# Patient Record
Sex: Male | Born: 1990 | Race: White | Hispanic: No | Marital: Married | Smoking: Never smoker
Health system: Southern US, Community
[De-identification: ages and names within clinical notes are randomized; demographics above are authoritative.]

## PROBLEM LIST (undated history)

## (undated) DIAGNOSIS — Z464 Encounter for fitting and adjustment of orthodontic device: Secondary | ICD-10-CM

## (undated) DIAGNOSIS — J45909 Unspecified asthma, uncomplicated: Secondary | ICD-10-CM

## (undated) DIAGNOSIS — K219 Gastro-esophageal reflux disease without esophagitis: Secondary | ICD-10-CM

## (undated) DIAGNOSIS — IMO0001 Reserved for inherently not codable concepts without codable children: Secondary | ICD-10-CM

## (undated) HISTORY — PX: TONSILLECTOMY: SUR1361

---
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
## 2016-01-08 ENCOUNTER — Ambulatory Visit
Admission: RE | Admit: 2016-01-08 | Discharge: 2016-01-08 | Disposition: A | Discharge status: Home / Self Care | Payer: 59 | Source: Ambulatory Visit | Attending: Physician Assistant | Admitting: Physician Assistant

## 2016-01-08 ENCOUNTER — Encounter: Payer: Self-pay | Admitting: Physician Assistant

## 2016-01-08 ENCOUNTER — Ambulatory Visit: Payer: Self-pay | Admitting: Physician Assistant

## 2016-01-08 VITALS — BP 120/70 | HR 79 | Temp 98.2°F

## 2016-01-08 DIAGNOSIS — R1310 Dysphagia, unspecified: Secondary | ICD-10-CM | POA: Diagnosis not present

## 2016-01-08 DIAGNOSIS — R0989 Other specified symptoms and signs involving the circulatory and respiratory systems: Secondary | ICD-10-CM

## 2016-01-08 NOTE — Progress Notes (Signed)
S: c/o sensation of fullness and foreign body in throat for about 2 weeks, pieces of food will get stuck in this area and will have to vomit to get them out, no fever/chills, no dif breathing  O: vitals wnl, nad, throat wnl, neck supple no lymph or masses palpated exteriorly, lungs c t a, cv rrr  A: foreign body sensation in throat  P: soft tissue neck xray, refer to ENT , consider referal to GI

## 2016-01-09 NOTE — Progress Notes (Signed)
I spoke with the patient about his xray report and he expressed understanding.  Per Susan's authorization I referred the patient to [facility].  Referral request was faxed today.

## 2016-04-02 ENCOUNTER — Encounter: Payer: Self-pay | Admitting: Physician Assistant

## 2016-04-02 ENCOUNTER — Encounter: Payer: Self-pay | Admitting: General Surgery

## 2016-04-02 ENCOUNTER — Ambulatory Visit: Payer: Self-pay | Admitting: Physician Assistant

## 2016-04-02 VITALS — BP 110/70 | HR 86 | Temp 98.6°F

## 2016-04-02 DIAGNOSIS — K602 Anal fissure, unspecified: Secondary | ICD-10-CM

## 2016-04-02 NOTE — Patient Instructions (Signed)
Anal Fissure, Adult °An anal fissure is a small tear or crack in the skin around the opening of the butt (anus). Bleeding from the tear or crack usually stops on its own within a few minutes. The bleeding may happen every time you poop (have a bowel movement) until the tear or crack heals. °HOME CARE °Eating and Drinking °· Avoid bananas and dairy products. These foods can make it hard to poop. °· Drink enough fluid to keep your pee (urine) clear or pale yellow. °· Eat a lot of fruit, whole grains, and vegetables. °General Instructions °· Keep the butt area as clean and dry as you can. °· Take a warm water bath (sitz bath) as told by your doctor. Do not use soap. °· Take over-the-counter and prescription medicines only as told by your doctor. °· Use creams or ointments only as told by your doctor. °· Keep all follow-up visits as told by your doctor. This is important. °GET HELP IF: °· You have more bleeding. °· You have a fever. °· You have watery poop (diarrhea) that is mixed with blood. °· You have pain. °· You problem gets worse, not better. °  °This information is not intended to replace advice given to you by your health care provider. Make sure you discuss any questions you have with your health care provider. °  °Document Released: 06/02/2011 Document Revised: 06/25/2015 Document Reviewed: 12/30/2014 °Elsevier Interactive Patient Education ©2016 Elsevier Inc. ° °

## 2016-04-02 NOTE — Progress Notes (Signed)
S: c/o rectal pain, worse with sitting, hurts with bm, occasional blood but no large amounts, says he feels a knot at the rectal area, sx on and off for a while, got really bad last few days  O: vitals wnl, nad, skin intact, rectal exam shows large anal fissure, no external hemorrhoids noted, n/v intact  A: anal fissure  P: warm water soaks, if area feels dry/itchy use small amount of vaseline only once a day, f/u with surgeon on Monday for eval, use miralax for stool softener

## 2016-04-05 ENCOUNTER — Other Ambulatory Visit: Payer: Self-pay

## 2016-04-05 ENCOUNTER — Ambulatory Visit (INDEPENDENT_AMBULATORY_CARE_PROVIDER_SITE_OTHER): Payer: Managed Care, Other (non HMO) | Admitting: General Surgery

## 2016-04-05 ENCOUNTER — Encounter: Payer: Self-pay | Admitting: General Surgery

## 2016-04-05 ENCOUNTER — Encounter: Payer: Self-pay | Admitting: *Deleted

## 2016-04-05 ENCOUNTER — Other Ambulatory Visit: Payer: Self-pay | Admitting: General Surgery

## 2016-04-05 DIAGNOSIS — K611 Rectal abscess: Secondary | ICD-10-CM

## 2016-04-05 DIAGNOSIS — K6289 Other specified diseases of anus and rectum: Secondary | ICD-10-CM | POA: Diagnosis not present

## 2016-04-05 MED ORDER — AMOXICILLIN-POT CLAVULANATE 875-125 MG PO TABS: 1.0000 | ORAL_TABLET | Freq: Two times a day (BID) | ORAL | Status: AC

## 2016-04-05 NOTE — Patient Instructions (Signed)
  Your procedure is scheduled on:04/06/16 Report to Day Surgery. [facility]OND FLOOR To find out your arrival time please call [phone] between 1PM - 3PM on 04/05/16.  Remember: Instructions that are not followed completely may result in serious medical risk, up to and including death, or upon the discretion of your surgeon and anesthesiologist your surgery may need to be rescheduled.    ___X_ 1. Do not eat food or drink liquids after midnight. No gum chewing or hard candies.     _X___ 2. No Alcohol for 24 hours before or after surgery.   _X___ 3. Do Not Smoke For 24 Hours Prior to Your Surgery.   ____ 4. Bring all medications with you on the day of surgery if instructed.    ___X_ 5. Notify your doctor if there is any change in your medical condition     (cold, fever, infections).       Do not wear jewelry, make-up, hairpins, clips or nail polish.  Do not wear lotions, powders, or perfumes. You may wear deodorant.  Do not shave 48 hours prior to surgery. Men may shave face and neck.  Do not bring valuables to the hospital.    [facility] belongings or valuables.               Contacts, dentures or bridgework may not be worn into surgery.  Leave your suitcase in the car. After surgery it may be brought to your room.  For patients admitted to the hospital, discharge time is determined by your                treatment team.   Patients discharged the day of surgery will not be allowed to drive home.   Please read over the following fact sheets that you were given:   Surgical Site Infection Prevention   _X___ Take these medicines the morning of surgery with A SIP OF WATER:    1. OMEPRAZOLE AT BEDTIME 04/05/16 AND AM OF SURGERY  2.   3.   4.  5.  6.  ____ Fleet Enema (as directed)   ____ Use CHG Soap as directed  __X__ Use inhalers on the day of surgery  ____ Stop metformin 2 days prior to surgery    ____ Take 1/2 of usual insulin dose  the night before surgery and none on the morning of surgery.   ____ Stop Coumadin/Plavix/aspirin on  ____ Stop Anti-inflammatories on    ____ Stop supplements until after surgery.    ____ Bring C-Pap to the hospital.

## 2016-04-05 NOTE — Patient Instructions (Signed)
Anal Fissure, Adult °An anal fissure is a small tear or crack in the skin around the anus. Bleeding from a fissure usually stops on its own within a few minutes. However, bleeding will often occur again with each bowel movement until the crack heals. °CAUSES °This condition may be caused by: °· Passing large, hard stool (feces). °· Frequent diarrhea. °· Constipation. °· Inflammatory bowel disease (Crohn disease or ulcerative colitis). °· Infections. °· Anal sex. °SYMPTOMS °Symptoms of this condition include: °· Bleeding from the rectum. °· Small amounts of blood seen on your stool, on toilet paper, or in the toilet after a bowel movement. °· Painful bowel movements. °· Itching or irritation around the anus. °DIAGNOSIS  °A health care provider may diagnose this condition by closely examining the anal area. An anal fissure can usually be seen with careful inspection. In some cases, a rectal exam may be performed, or a short tube (anoscope) may be used to examine the anal canal. °TREATMENT °Treatment for this condition may include: °· Taking steps to avoid constipation. This may include making changes to your diet, such as increasing your intake of fiber or fluid. °· Taking fiber supplements. These supplements can soften your stool to help make bowel movements easier. Your health care provider may also prescribe a stool softener if your stool is often hard. °· Taking sitz baths. This may help to heal the tear. °· Using medicated creams or ointments. These may be prescribed to lessen discomfort. °HOME CARE INSTRUCTIONS °Eating and Drinking °· Avoid foods that may be constipating, such as bananas and dairy products. °· Drink enough fluid to keep your urine clear or pale yellow. °· Maintain a diet that is high in fruits, whole grains, and vegetables. °General Instructions °· Keep the anal area as clean and dry as possible. °· Take sitz baths as told by your health care provider. Do not use soap in the sitz baths. °· Take  over-the-counter and prescription medicines only as told by your health care provider. °· Use creams or ointments only as told by your health care provider. °· Keep all follow-up visits as told by your health care provider. This is important. °SEEK MEDICAL CARE IF: °· You have more bleeding. °· You have a fever. °· You have diarrhea that is mixed with blood. °· You continue to have pain. °· Your problem is getting worse rather than better. °  °This information is not intended to replace advice given to you by your health care provider. Make sure you discuss any questions you have with your health care provider. °  °Document Released: 10/04/2005 Document Revised: 06/25/2015 Document Reviewed: 12/30/2014 °Elsevier Interactive Patient Education ©2016 Elsevier Inc. ° °

## 2016-04-05 NOTE — Progress Notes (Signed)
Patient ID: Brett Fields, male   DOB: 12/19/1990, 24 y.o.   MRN: [id number]  Chief Complaint  Patient presents with  . Other    anal fissure    HPI Brett Fields is a 24 y.o. male here today for a evaluation of a anal fissure. Patient states he noticed some blood with wrapping. This has been going on since Tuesday. Patient has been using sitz baths and Nifidine with lidocaine. Has only found relief with stiz baths. No relief with positional changes.  I have reviewed the history of present illness with the patient.  HPI  No past medical history on file.  Past Surgical History  Procedure Laterality Date  . Wisdom tooth extraction  2012    No family history on file.  Social History Social History  Substance Use Topics  . Smoking status: Never Smoker   . Smokeless tobacco: None  . Alcohol Use: None    No Known Allergies  Current Outpatient Prescriptions  Medication Sig Dispense Refill  . omeprazole (PRILOSEC) 40 MG capsule TAKE ONE CAPSULE BY MOUTH EVERY DAY 30 MINUTES PRIOR TO EVENING MEAL  12  . polyethylene glycol powder (GLYCOLAX/MIRALAX) powder Take 1 Container by mouth once.     No current facility-administered medications for this visit.    Review of Systems Review of Systems  Constitutional: Negative.   Respiratory: Negative.   Cardiovascular: Negative.     Blood pressure 124/72, pulse 76, resp. rate 12, height 5' 9" (1.753 m), weight 197 lb (89.359 kg).  Physical Exam Physical Exam  Constitutional: He is oriented to person, place, and time. He appears well-developed and well-nourished.  Eyes: Conjunctivae are normal. No scleral icterus.  Neck: Neck supple.  Cardiovascular: Normal rate, regular rhythm and normal heart sounds.   Pulmonary/Chest: Effort normal and breath sounds normal.  Abdominal: Soft. Bowel sounds are normal. There is no tenderness.  Genitourinary: Rectal exam shows fissure and tenderness. Rectal exam shows no external hemorrhoid  and no internal hemorrhoid.  Unable to perform adequate internal rectal exam due to extreme pain   Lymphadenopathy:    He has no cervical adenopathy.  Neurological: He is alert and oriented to person, place, and time.  Skin: Skin is warm and dry.  There is marked tenderness in anterior aspect. Even with xylocaine jelly pain and tenderness precluded  Satisy=factory exam.  Data Reviewed  Notes reviewedd Assessment    Unable to make final assessment of rectal abscess or anal fissure due to severe pain on physical exam. Pt is unable to sit or lie on his back without hurting. Possible he has a perirectal abscess anterior location given the degree of pain.     Plan   Discussed fully with pt and his father(by phone) who is a general surgeon. All agreeable to exam under anesthesia and treatment as indicated. Patient to be scheduled for an exam under anesthesia  at [facility] tomorrow  This patient's surgery has been scheduled for tomorrow, 04-06-16.    Ref. Dr. Gilbert..This information has been scribed by Jessica Qualls CMA.    Brett Fields 04/05/2016, 11:54 AM    

## 2016-04-06 ENCOUNTER — Ambulatory Visit: Payer: Managed Care, Other (non HMO) | Admitting: Anesthesiology

## 2016-04-06 ENCOUNTER — Encounter: Payer: Self-pay | Admitting: *Deleted

## 2016-04-06 ENCOUNTER — Ambulatory Visit
Admission: RE | Admit: 2016-04-06 | Discharge: 2016-04-06 | Disposition: A | Discharge status: Home / Self Care | Payer: Managed Care, Other (non HMO) | Source: Ambulatory Visit | Attending: General Surgery | Admitting: General Surgery

## 2016-04-06 ENCOUNTER — Encounter
Admission: RE | Disposition: A | Discharge status: Home / Self Care | Payer: Self-pay | Source: Ambulatory Visit | Attending: General Surgery

## 2016-04-06 DIAGNOSIS — K648 Other hemorrhoids: Secondary | ICD-10-CM | POA: Insufficient documentation

## 2016-04-06 DIAGNOSIS — K611 Rectal abscess: Secondary | ICD-10-CM | POA: Diagnosis not present

## 2016-04-06 DIAGNOSIS — J45909 Unspecified asthma, uncomplicated: Secondary | ICD-10-CM | POA: Diagnosis not present

## 2016-04-06 DIAGNOSIS — K219 Gastro-esophageal reflux disease without esophagitis: Secondary | ICD-10-CM | POA: Diagnosis not present

## 2016-04-06 DIAGNOSIS — Z79899 Other long term (current) drug therapy: Secondary | ICD-10-CM | POA: Diagnosis not present

## 2016-04-06 HISTORY — PX: INCISION AND DRAINAGE PERIRECTAL ABSCESS: SHX1804

## 2016-04-06 HISTORY — DX: Unspecified asthma, uncomplicated: J45.909

## 2016-04-06 HISTORY — PX: RECTAL EXAM UNDER ANESTHESIA: SHX6399

## 2016-04-06 HISTORY — DX: Gastro-esophageal reflux disease without esophagitis: K21.9

## 2016-04-06 SURGERY — EXAM UNDER ANESTHESIA, RECTUM
Anesthesia: General | Wound class: Clean Contaminated

## 2016-04-06 MED ORDER — PROPOFOL 10 MG/ML IV BOLUS
INTRAVENOUS | Status: DC | PRN
  Administered 2016-04-06: 180 mg via INTRAVENOUS
  Administered 2016-04-06: 20 mg via INTRAVENOUS

## 2016-04-06 MED ORDER — PIPERACILLIN-TAZOBACTAM 3.375 G IVPB 30 MIN
3.3750 g | Freq: Once | INTRAVENOUS | Status: AC
  Administered 2016-04-06: 3.375 g via INTRAVENOUS
  Filled 2016-04-06: qty 50

## 2016-04-06 MED ORDER — LACTATED RINGERS IV SOLN
INTRAVENOUS | Status: DC
  Administered 2016-04-06: 11:00:00 via INTRAVENOUS

## 2016-04-06 MED ORDER — ACETAMINOPHEN 10 MG/ML IV SOLN
INTRAVENOUS | Status: AC
  Filled 2016-04-06: qty 100

## 2016-04-06 MED ORDER — LIDOCAINE HCL (PF) 1 % IJ SOLN
INTRAMUSCULAR | Status: AC
  Filled 2016-04-06: qty 30

## 2016-04-06 MED ORDER — PIPERACILLIN-TAZOBACTAM 3.375 G IVPB
INTRAVENOUS | Status: AC
  Administered 2016-04-06: 3.375 g via INTRAVENOUS
  Filled 2016-04-06: qty 50

## 2016-04-06 MED ORDER — BUPIVACAINE HCL (PF) 0.5 % IJ SOLN
INTRAMUSCULAR | Status: DC | PRN
  Administered 2016-04-06: 20 mL

## 2016-04-06 MED ORDER — ACETAMINOPHEN 10 MG/ML IV SOLN
INTRAVENOUS | Status: DC | PRN
  Administered 2016-04-06: 1000 mg via INTRAVENOUS

## 2016-04-06 MED ORDER — ONDANSETRON HCL 4 MG/2ML IJ SOLN
INTRAMUSCULAR | Status: DC | PRN
  Administered 2016-04-06: 4 mg via INTRAVENOUS

## 2016-04-06 MED ORDER — ONDANSETRON HCL 4 MG/2ML IJ SOLN: 4.0000 mg | Freq: Once | INTRAMUSCULAR | Status: DC | PRN

## 2016-04-06 MED ORDER — LIDOCAINE HCL (CARDIAC) 20 MG/ML IV SOLN
INTRAVENOUS | Status: DC | PRN
  Administered 2016-04-06: 40 mg via INTRAVENOUS

## 2016-04-06 MED ORDER — BUPIVACAINE HCL (PF) 0.5 % IJ SOLN
INTRAMUSCULAR | Status: AC
  Filled 2016-04-06: qty 30

## 2016-04-06 MED ORDER — FENTANYL CITRATE (PF) 100 MCG/2ML IJ SOLN
INTRAMUSCULAR | Status: DC | PRN
  Administered 2016-04-06: 100 ug via INTRAVENOUS
  Administered 2016-04-06: 50 ug via INTRAVENOUS

## 2016-04-06 MED ORDER — MIDAZOLAM HCL 2 MG/2ML IJ SOLN
INTRAMUSCULAR | Status: DC | PRN
  Administered 2016-04-06: 2 mg via INTRAVENOUS

## 2016-04-06 MED ORDER — FENTANYL CITRATE (PF) 100 MCG/2ML IJ SOLN: 25.0000 ug | INTRAMUSCULAR | Status: DC | PRN

## 2016-04-06 MED ORDER — BACITRACIN ZINC 500 UNIT/GM EX OINT
TOPICAL_OINTMENT | CUTANEOUS | Status: AC
  Filled 2016-04-06: qty 28.35

## 2016-04-06 MED ORDER — OXYCODONE-ACETAMINOPHEN 5-325 MG PO TABS
1.0000 | ORAL_TABLET | Freq: Once | ORAL | Status: AC
  Administered 2016-04-06: 1 via ORAL

## 2016-04-06 MED ORDER — OXYCODONE-ACETAMINOPHEN 5-325 MG PO TABS
ORAL_TABLET | ORAL | Status: AC
  Administered 2016-04-06: 1 via ORAL
  Filled 2016-04-06: qty 1

## 2016-04-06 SURGICAL SUPPLY — 26 items
BLADE SURG 15 STRL SS SAFETY (BLADE) ×4 IMPLANT
BRIEF STRETCH MATERNITY 2XLG (MISCELLANEOUS) ×4 IMPLANT
CANISTER SUCT 1200ML W/VALVE (MISCELLANEOUS) ×4 IMPLANT
DRAPE LAPAROTOMY 77X122 PED (DRAPES) ×4 IMPLANT
DRAPE LEGGINS SURG 28X43 STRL (DRAPES) ×4 IMPLANT
DRAPE TABLE BACK 80X90 (DRAPES) IMPLANT
DRAPE UNDER BUTTOCK W/FLU (DRAPES) ×4 IMPLANT
ELECT REM PT RETURN 9FT ADLT (ELECTROSURGICAL) ×4
ELECTRODE REM PT RTRN 9FT ADLT (ELECTROSURGICAL) ×2 IMPLANT
GLOVE BIO SURGEON STRL SZ7 (GLOVE) ×12 IMPLANT
GLOVE INDICATOR 7.5 STRL GRN (GLOVE) ×12 IMPLANT
GOWN STRL REUS W/ TWL LRG LVL3 (GOWN DISPOSABLE) ×6 IMPLANT
GOWN STRL REUS W/TWL LRG LVL3 (GOWN DISPOSABLE) ×6
KIT RM TURNOVER CYSTO AR (KITS) ×4 IMPLANT
LABEL OR SOLS (LABEL) IMPLANT
NEEDLE HYPO 25X1 1.5 SAFETY (NEEDLE) ×4 IMPLANT
PACK BASIN MINOR ARMC (MISCELLANEOUS) ×4 IMPLANT
PAD OB MATERNITY 4.3X12.25 (PERSONAL CARE ITEMS) ×4 IMPLANT
PAD PREP 24X41 OB/GYN DISP (PERSONAL CARE ITEMS) ×4 IMPLANT
SOL PREP PVP 2OZ (MISCELLANEOUS) ×4
SOLUTION PREP PVP 2OZ (MISCELLANEOUS) ×2 IMPLANT
SUCT SIGMOIDOSCOPE TIP 18 W/TU (SUCTIONS) IMPLANT
SURGILUBE 2OZ TUBE FLIPTOP (MISCELLANEOUS) ×8 IMPLANT
SUT VIC AB 3-0 SH 27 (SUTURE)
SUT VIC AB 3-0 SH 27X BRD (SUTURE) IMPLANT
SYR CONTROL 10ML (SYRINGE) ×4 IMPLANT

## 2016-04-06 NOTE — Discharge Instructions (Signed)
AMBULATORY SURGERY  °DISCHARGE INSTRUCTIONS ° ° °1) The drugs that you were given will stay in your system until tomorrow so for the next 24 hours you should not: ° °A) Drive an automobile °B) Make any legal decisions °C) Drink any alcoholic beverage ° ° °2) You may resume regular meals tomorrow.  Today it is better to start with liquids and gradually work up to solid foods. ° °You may eat anything you prefer, but it is better to start with liquids, then soup and crackers, and gradually work up to solid foods. ° ° °3) Please notify your doctor immediately if you have any unusual bleeding, trouble breathing, redness and pain at the surgery site, drainage, fever, or pain not relieved by medication. ° ° ° °4) Additional Instructions: ° ° ° ° ° ° ° °Please contact your physician with any problems or Same Day Surgery at [phone], Monday through Friday 6 am to 4 pm, or [place] [facility] [place] Main number at [phone]. °

## 2016-04-06 NOTE — H&P (View-Only) (Signed)
Patient ID: Brett Fields, male   DOB: Sep 05, 1991, 25 y.o.   MRN: [id number]  Chief Complaint  Patient presents with  . Other    anal fissure    HPI Brett Fields is a 25 y.o. male here today for a evaluation of a anal fissure. Patient states he noticed some blood with wrapping. This has been going on since Tuesday. Patient has been using sitz baths and Nifidine with lidocaine. Has only found relief with stiz baths. No relief with positional changes.  I have reviewed the history of present illness with the patient.  HPI  No past medical history on file.  Past Surgical History  Procedure Laterality Date  . Wisdom tooth extraction  2012    No family history on file.  Social History Social History  Substance Use Topics  . Smoking status: Never Smoker   . Smokeless tobacco: None  . Alcohol Use: None    No Known Allergies  Current Outpatient Prescriptions  Medication Sig Dispense Refill  . omeprazole (PRILOSEC) 40 MG capsule TAKE ONE CAPSULE BY MOUTH EVERY DAY 30 MINUTES PRIOR TO EVENING MEAL  12  . polyethylene glycol powder (GLYCOLAX/MIRALAX) powder Take 1 Container by mouth once.     No current facility-administered medications for this visit.    Review of Systems Review of Systems  Constitutional: Negative.   Respiratory: Negative.   Cardiovascular: Negative.     Blood pressure 124/72, pulse 76, resp. rate 12, height 5\' 9"  (1.753 m), weight 197 lb (89.359 kg).  Physical Exam Physical Exam  Constitutional: He is oriented to person, place, and time. He appears well-developed and well-nourished.  Eyes: Conjunctivae are normal. No scleral icterus.  Neck: Neck supple.  Cardiovascular: Normal rate, regular rhythm and normal heart sounds.   Pulmonary/Chest: Effort normal and breath sounds normal.  Abdominal: Soft. Bowel sounds are normal. There is no tenderness.  Genitourinary: Rectal exam shows fissure and tenderness. Rectal exam shows no external hemorrhoid  and no internal hemorrhoid.  Unable to perform adequate internal rectal exam due to extreme pain   Lymphadenopathy:    He has no cervical adenopathy.  Neurological: He is alert and oriented to person, place, and time.  Skin: Skin is warm and dry.  There is marked tenderness in anterior aspect. Even with xylocaine jelly pain and tenderness precluded  Satisy=factory exam.  Data Reviewed  Notes reviewedd Assessment    Unable to make final assessment of rectal abscess or anal fissure due to severe pain on physical exam. Pt is unable to sit or lie on his back without hurting. Possible he has a perirectal abscess anterior location given the degree of pain.     Plan   Discussed fully with pt and his father(by phone) who is a Development worker, international aidgeneral surgeon. All agreeable to exam under anesthesia and treatment as indicated. [facility]  This patient's surgery has been scheduled for tomorrow, 04-06-16.    Ref. Dr. Sullivan LoneGilbert..This information has been scribed by Ples SpecterJessica Qualls CMA.    SANKAR,SEEPLAPUTHUR G 04/05/2016, 11:54 AM

## 2016-04-06 NOTE — Interval H&P Note (Signed)
History and Physical Interval Note:  04/06/2016 11:36 AM  Brett McalpineNathaniel W Fields  has presented today for surgery, with the diagnosis of RECTAL PAIN  The various methods of treatment have been discussed with the patient and family. After consideration of risks, benefits and other options for treatment, the patient has consented to  Procedure(s): RECTAL EXAM UNDER ANESTHESIA (N/A) FISSURECTOMY (N/A) SPHINCTEROTOMY (N/A) as a surgical intervention .  The patient's history has been reviewed, patient examined, no change in status, stable for surgery.  I have reviewed the patient's chart and labs.  Questions were answered to the patient's satisfaction.     SANKAR,SEEPLAPUTHUR G

## 2016-04-06 NOTE — Anesthesia Postprocedure Evaluation (Signed)
Anesthesia Post Note  Patient: Marland Mcalpineathaniel W Trnka  Procedure(s) Performed: Procedure(s) (LRB): RECTAL EXAM UNDER ANESTHESIA (N/A) IRRIGATION AND DEBRIDEMENT PERIRECTAL ABSCESS  Patient location during evaluation: PACU Anesthesia Type: General Level of consciousness: awake and alert Pain management: pain level controlled Vital Signs Assessment: post-procedure vital signs reviewed and stable Respiratory status: spontaneous breathing, nonlabored ventilation, respiratory function stable and patient connected to nasal cannula oxygen Cardiovascular status: blood pressure returned to baseline and stable Postop Assessment: no signs of nausea or vomiting Anesthetic complications: no    Last Vitals:  Filed Vitals:   04/06/16 1353 04/06/16 1459  BP: 114/56 111/54  Pulse: 81 81  Temp: 37.1 C   Resp: 16 16    Last Pain:  Filed Vitals:   04/06/16 1500  PainSc: 2                  Teresea Donley S

## 2016-04-06 NOTE — Op Note (Signed)
Preop diagnosis: Rectal pain and suspected perianal abscess versus fissure  Post op diagnosis: Anterior perirectal abscess drained internally  Operation: Drainage of perirectal abscess , rectal  Exam. Internal hemorrhoids  Surgeon: Horton ChinS. J. Syna Gad Assistant:     Anesthesia: Gen.  Complications: None  EBL: Minimal  Drains: None  Description: Patient was put to sleep with an LMA and placed in the lithotomy position. Anal area was prepped and draped sterile field. Timeout was performed. There were no visible abnormality in the external skin surrounding the anal area. Distal speculum examination was then completed with the finding of a small opening anteriorly inside the 8 just above the dentate line with a small amount of purulent material draining and pressure from the anterior aspect in the extruded a little bit more of this. There was no palpable induration or masslike effect in the area of the abscess. The patient did have 2 prominent internal hemorrhoids which are not prolapsing at this time nor bleeding. After ensuring that there were no abnormalities in that the abscesses satisfactorily drained speculum was withdrawn the surrounding area was infiltrated with 20 mL of 0.5% Marcaine for postop analgesia. Patient subsequently was reversed extubated and returned recovery room stable condition

## 2016-04-06 NOTE — Anesthesia Preprocedure Evaluation (Signed)
Anesthesia Evaluation  Patient identified by MRN, date of birth, ID band Patient awake    Reviewed: Allergy & Precautions, NPO status , Patient's Chart, lab work & pertinent test results  History of Anesthesia Complications Negative for: history of anesthetic complications  Airway Mallampati: I       Dental   Pulmonary neg pulmonary ROS, asthma ,           Cardiovascular negative cardio ROS       Neuro/Psych negative neurological ROS     GI/Hepatic Neg liver ROS, GERD  Medicated and Controlled,  Endo/Other  negative endocrine ROS  Renal/GU negative Renal ROS     Musculoskeletal   Abdominal   Peds  Hematology   Anesthesia Other Findings   Reproductive/Obstetrics                             Anesthesia Physical Anesthesia Plan  ASA: II  Anesthesia Plan: General   Post-op Pain Management:    Induction: Intravenous  Airway Management Planned: LMA  Additional Equipment:   Intra-op Plan:   Post-operative Plan:   Informed Consent: I have reviewed the patients History and Physical, chart, labs and discussed the procedure including the risks, benefits and alternatives for the proposed anesthesia with the patient or authorized representative who has indicated his/her understanding and acceptance.     Plan Discussed with:   Anesthesia Plan Comments:         Anesthesia Quick Evaluation

## 2016-04-06 NOTE — Transfer of Care (Signed)
Immediate Anesthesia Transfer of Care Note  Patient: Brett Fields  Procedure(s) Performed: Procedure(s): RECTAL EXAM UNDER ANESTHESIA (N/A) IRRIGATION AND DEBRIDEMENT PERIRECTAL ABSCESS  Patient Location: PACU  Anesthesia Type:General  Level of Consciousness: awake  Airway & Oxygen Therapy: Patient Spontanous Breathing and Patient connected to face mask oxygen  Post-op Assessment: Report given to RN and Post -op Vital signs reviewed and stable  Post vital signs: Reviewed and stable  Last Vitals:  Filed Vitals:   04/06/16 1003  BP: 131/94  Pulse: 122  Temp: 37.1 C  Resp: 20    Last Pain:  Filed Vitals:   04/06/16 1258  PainSc: 4       Patients Stated Pain Goal: 2 (04/06/16 1003)  Complications: No apparent anesthesia complications

## 2016-04-06 NOTE — Anesthesia Procedure Notes (Signed)
Procedure Name: LMA Insertion Date/Time: 04/06/2016 12:10 PM Performed by: Henrietta HooverPOPE, Tsuruko Murtha Pre-anesthesia Checklist: Patient identified, Emergency Drugs available, Suction available, Patient being monitored and Timeout performed Patient Re-evaluated:Patient Re-evaluated prior to inductionOxygen Delivery Method: Circle system utilized Preoxygenation: Pre-oxygenation with 100% oxygen Intubation Type: IV induction Ventilation: Mask ventilation without difficulty LMA: LMA inserted LMA Size: 4.0 Number of attempts: 1 Placement Confirmation: positive ETCO2 and breath sounds checked- equal and bilateral Tube secured with: Tape Dental Injury: Teeth and Oropharynx as per pre-operative assessment

## 2016-04-07 ENCOUNTER — Encounter: Payer: Self-pay | Admitting: General Surgery

## 2016-04-22 ENCOUNTER — Ambulatory Visit: Payer: Managed Care, Other (non HMO) | Admitting: General Surgery

## 2016-06-15 ENCOUNTER — Encounter: Payer: Self-pay | Admitting: *Deleted

## 2016-08-05 ENCOUNTER — Encounter: Payer: Self-pay | Admitting: Physician Assistant

## 2016-08-05 ENCOUNTER — Ambulatory Visit: Payer: Self-pay | Admitting: Physician Assistant

## 2016-08-05 VITALS — BP 100/80 | HR 78 | Temp 98.5°F

## 2016-08-05 DIAGNOSIS — R1310 Dysphagia, unspecified: Secondary | ICD-10-CM

## 2016-08-05 NOTE — Progress Notes (Signed)
S: c/o feeling like he has a lump in his throat, not every day, just comes and goes, will sometimes feel like food gets hung up, cousin had thyroid issues and had to have his removed, father is a Careers advisersurgeon and suggested he see GI, no v/d, no dif breathing, no weight loss/gain, is fatigued  O: vitals wnl, nad, thyroid nontender, no nodules palpated, lungs c t a, cv rrr  A: dysphagia  P: exec panel (fasting) tomorrow, will refer to GI, if thyroid levels off will do US of thyroid

## 2016-08-06 ENCOUNTER — Other Ambulatory Visit: Payer: Self-pay

## 2016-08-06 DIAGNOSIS — Z299 Encounter for prophylactic measures, unspecified: Secondary | ICD-10-CM

## 2016-08-06 NOTE — Progress Notes (Signed)
Patient came in to have blood drawn for testing per Susan's authorization. 

## 2016-08-07 LAB — CMP12+LP+TP+TSH+6AC+CBC/D/PLT
ALBUMIN: 4.7 g/dL (ref 3.5–5.5)
ALK PHOS: 50 IU/L (ref 39–117)
ALT: 42 IU/L (ref 0–44)
AST: 23 IU/L (ref 0–40)
Albumin/Globulin Ratio: 2 (ref 1.2–2.2)
BASOS: 0 %
BUN/Creatinine Ratio: 17 (ref 9–20)
BUN: 14 mg/dL (ref 6–20)
Basophils Absolute: 0 10*3/uL (ref 0.0–0.2)
Bilirubin Total: 0.6 mg/dL (ref 0.0–1.2)
CALCIUM: 9.9 mg/dL (ref 8.7–10.2)
CHOLESTEROL TOTAL: 194 mg/dL (ref 100–199)
Chloride: 102 mmol/L (ref 96–106)
Chol/HDL Ratio: 4.3 ratio units (ref 0.0–5.0)
Creatinine, Ser: 0.84 mg/dL (ref 0.76–1.27)
EOS (ABSOLUTE): 0.1 10*3/uL (ref 0.0–0.4)
ESTIMATED CHD RISK: 0.8 times avg. (ref 0.0–1.0)
Eos: 1 %
FREE THYROXINE INDEX: 2 (ref 1.2–4.9)
GFR calc Af Amer: 142 mL/min/{1.73_m2} (ref >59)
GFR calc non Af Amer: 123 mL/min/{1.73_m2} (ref >59)
GGT: 20 IU/L (ref 0–65)
GLOBULIN, TOTAL: 2.3 g/dL (ref 1.5–4.5)
Glucose: 104 mg/dL — ABNORMAL HIGH (ref 65–99)
HDL: 45 mg/dL (ref >39)
Hematocrit: 47.2 % (ref 37.5–51.0)
Hemoglobin: 16.5 g/dL (ref 12.6–17.7)
IMMATURE GRANS (ABS): 0 10*3/uL (ref 0.0–0.1)
IMMATURE GRANULOCYTES: 0 %
IRON: 92 ug/dL (ref 38–169)
LDH: 151 IU/L (ref 121–224)
LDL CALC: 119 mg/dL — AB (ref 0–99)
LYMPHS ABS: 1.5 10*3/uL (ref 0.7–3.1)
LYMPHS: 30 %
MCH: 28.4 pg (ref 26.6–33.0)
MCHC: 35 g/dL (ref 31.5–35.7)
MCV: 81 fL (ref 79–97)
MONOS ABS: 0.6 10*3/uL (ref 0.1–0.9)
Monocytes: 11 %
NEUTROS PCT: 58 %
Neutrophils Absolute: 2.9 10*3/uL (ref 1.4–7.0)
PLATELETS: 263 10*3/uL (ref 150–379)
Phosphorus: 3.8 mg/dL (ref 2.5–4.5)
Potassium: 4.7 mmol/L (ref 3.5–5.2)
RBC: 5.82 x10E6/uL — ABNORMAL HIGH (ref 4.14–5.80)
RDW: 13.5 % (ref 12.3–15.4)
SODIUM: 141 mmol/L (ref 134–144)
T3 UPTAKE RATIO: 24 % (ref 24–39)
T4 TOTAL: 8.2 ug/dL (ref 4.5–12.0)
TOTAL PROTEIN: 7 g/dL (ref 6.0–8.5)
TRIGLYCERIDES: 152 mg/dL — AB (ref 0–149)
TSH: 0.812 u[IU]/mL (ref 0.450–4.500)
Uric Acid: 5.8 mg/dL (ref 3.7–8.6)
VLDL Cholesterol Cal: 30 mg/dL (ref 5–40)
WBC: 5.1 10*3/uL (ref 3.4–10.8)

## 2016-08-10 LAB — HGB A1C W/O EAG: HEMOGLOBIN A1C: 5.2 % (ref 4.8–5.6)

## 2016-08-10 LAB — SPECIMEN STATUS REPORT

## 2016-08-17 ENCOUNTER — Other Ambulatory Visit: Payer: Self-pay

## 2016-08-17 ENCOUNTER — Encounter: Payer: Self-pay | Admitting: *Deleted

## 2016-08-17 ENCOUNTER — Encounter: Payer: Self-pay | Admitting: Gastroenterology

## 2016-08-17 ENCOUNTER — Ambulatory Visit (INDEPENDENT_AMBULATORY_CARE_PROVIDER_SITE_OTHER): Payer: Managed Care, Other (non HMO) | Admitting: Gastroenterology

## 2016-08-17 VITALS — BP 130/78 | HR 75 | Temp 98.2°F | Ht 70.0 in | Wt 201.0 lb

## 2016-08-17 DIAGNOSIS — K219 Gastro-esophageal reflux disease without esophagitis: Secondary | ICD-10-CM

## 2016-08-17 DIAGNOSIS — R1310 Dysphagia, unspecified: Secondary | ICD-10-CM | POA: Insufficient documentation

## 2016-08-17 DIAGNOSIS — R1319 Other dysphagia: Secondary | ICD-10-CM

## 2016-08-17 NOTE — Patient Instructions (Signed)
I have scheduled your upper endoscopy for 08/18/2016. They will be calling you today after 2:00 to give you the exact time to come in. If you haven't heard from them by 3:30, call the number on the paper I gave you to find out the exact time to come in. You are going to need someone to take you there, stay with you, then bring you back home because you will be sedated.   I have scheduled you for a follow up appointment with Dr. Tobi BastosAnna. Please refer to your appointment date and time below. Please call us if you need to cancel, reschedule, or if you have any questions or concerns.

## 2016-08-17 NOTE — Progress Notes (Signed)
Gastroenterology Consultation  Referring Provider:     No ref. provider found Primary Care Physician:  No PCP Per Patient Primary Gastroenterologist:  Dr. Wyline MoodKiran Pammie Chirino  Reason for Consultation:     Dysphagia        HPI:   Brett Fields is a 25 y.o. y/o male referred for consultation & management.   Dysphagia: Onset and any progression: First episoide was in February 2017 , went to an ENT and had a "camera test" and was prescribed omeprazole , he took omeprazole  For a few months and it relieved the issues with swallowing, then stopped and the issue began to recur in September 2017 .  Frequency: Occurs upto 2 days a week and has not got any worse. In total since September has had 10 occasions  Foods affected : Usually affects solids , soft food more often than meat, salad gets stuck very often. Points to the upper part of his chest , feels like a pressure , sometimes he has to "throw up the food " to feel better.  Prior episodes of impaction:  Not required any ER visits for the same  History of asthma/allergy : History of asthma .  History of heartburn/Reflux : Does have acid reflux and describes sensation of " burning in chest " worse when he "burps" and at times brings up "acid" Weight loss : none , not a smoker   Prior EGD: none  PPI/H2 blocker use : Omeprazole.   Presently still has symptoms.   He restarted omeprazole on September 20th, takes it once a day , morning , empty stomach .    Past Medical History:  Diagnosis Date  . Asthma   . GERD (gastroesophageal reflux disease)     Past Surgical History:  Procedure Laterality Date  . INCISION AND DRAINAGE PERIRECTAL ABSCESS  04/06/2016   Procedure: IRRIGATION AND DEBRIDEMENT PERIRECTAL ABSCESS;  Surgeon: Kieth BrightlySeeplaputhur G Sankar, MD;  Location: [facility] ORS;  Service: General;;  . RECTAL EXAM UNDER ANESTHESIA N/A 04/06/2016   Procedure: RECTAL EXAM UNDER ANESTHESIA;  Surgeon: Kieth BrightlySeeplaputhur G Sankar, MD;  Location: [facility] ORS;   Service: General;  Laterality: N/A;  . TONSILLECTOMY    . WISDOM TOOTH EXTRACTION  2012    Prior to Admission medications   Medication Sig Start Date End Date Taking? Authorizing Provider  omeprazole (PRILOSEC) 40 MG capsule TAKE ONE CAPSULE BY MOUTH EVERY DAY 30 MINUTES PRIOR TO EVENING MEAL 03/01/16  Yes Historical Provider, MD  polyethylene glycol powder (GLYCOLAX/MIRALAX) powder Take 1 Container by mouth once.   Yes Historical Provider, MD    History reviewed. No pertinent family history.   Social History  Substance Use Topics  . Smoking status: Never Smoker  . Smokeless tobacco: Never Used  . Alcohol use No    Allergies as of 08/17/2016  . (No Known Allergies)    Review of Systems:    All systems reviewed and negative except where noted in HPI.   Physical Exam:  There were no vitals taken for this visit. No LMP for male patient. Psych:  Alert and cooperative. Normal mood and affect. General:   Alert,  Well-developed, well-nourished, pleasant and cooperative in NAD Head:  Normocephalic and atraumatic. Eyes:  Sclera clear, no icterus.   Conjunctiva pink. Ears:  Normal auditory acuity. Nose:  No deformity, discharge, or lesions. Mouth:  No deformity or lesions,oropharynx pink & moist. Neck:  Supple; no masses or thyromegaly. Lungs:  Respirations even and unlabored.  Clear throughout  to auscultation.   No wheezes, crackles, or rhonchi. No acute distress. Heart:  Regular rate and rhythm; no murmurs, clicks, rubs, or gallops. Abdomen:  Normal bowel sounds.  No bruits.  Soft, non-tender and non-distended without masses, hepatosplenomegaly or hernias noted.  No guarding or rebound tenderness.    Msk:  Symmetrical without gross deformities. Good, equal movement & strength bilaterally. Pulses:  Normal pulses noted. Extremities:  No clubbing or edema.  No cyanosis. Neurologic:  Alert and oriented x3;  grossly normal neurologically. Skin:  Intact without significant lesions or  rashes. No jaundice. Lymph Nodes:  No significant cervical adenopathy. Psych:  Alert and cooperative. Normal mood and affect.  Imaging Studies: No results found.  Assessment and Plan:   Brett Fields is a 25 y.o. y/o male has been referred for dysphagia. He has had on and off symptoms for a few months, with some but not complete relief with use of PPI. He does have a history of asthma.Presently on PPI but still symptomatic.   Plan :  1. Dysphagia : EGD to evaluate for eosinophilic esophagitis.  2. GERD: on PPI , will discuss further management at next    I have discussed alternative options, risks & benefits,  which include, but are not limited to, bleeding, infection, perforation,respiratory complication & drug reaction.  The patient agrees with this plan & written consent will be obtained.    Follow up in 4 weeks.   Dr Wyline MoodKiran Chayson Charters MD

## 2016-08-18 ENCOUNTER — Ambulatory Visit
Admission: RE | Admit: 2016-08-18 | Discharge: 2016-08-18 | Disposition: A | Discharge status: Home / Self Care | Payer: Managed Care, Other (non HMO) | Source: Ambulatory Visit | Attending: Gastroenterology | Admitting: Gastroenterology

## 2016-08-18 ENCOUNTER — Ambulatory Visit: Payer: Managed Care, Other (non HMO) | Admitting: Anesthesiology

## 2016-08-18 ENCOUNTER — Encounter
Admission: RE | Disposition: A | Discharge status: Home / Self Care | Payer: Self-pay | Source: Ambulatory Visit | Attending: Gastroenterology

## 2016-08-18 DIAGNOSIS — K219 Gastro-esophageal reflux disease without esophagitis: Secondary | ICD-10-CM | POA: Insufficient documentation

## 2016-08-18 DIAGNOSIS — R1310 Dysphagia, unspecified: Secondary | ICD-10-CM | POA: Diagnosis not present

## 2016-08-18 DIAGNOSIS — J45909 Unspecified asthma, uncomplicated: Secondary | ICD-10-CM | POA: Insufficient documentation

## 2016-08-18 HISTORY — DX: Encounter for fitting and adjustment of orthodontic device: Z46.4

## 2016-08-18 HISTORY — DX: Reserved for inherently not codable concepts without codable children: IMO0001

## 2016-08-18 HISTORY — PX: ESOPHAGOGASTRODUODENOSCOPY (EGD) WITH PROPOFOL: SHX5813

## 2016-08-18 SURGERY — ESOPHAGOGASTRODUODENOSCOPY (EGD) WITH PROPOFOL
Anesthesia: Monitor Anesthesia Care | Wound class: Clean Contaminated

## 2016-08-18 MED ORDER — ACETAMINOPHEN 325 MG PO TABS: 325.0000 mg | ORAL_TABLET | ORAL | Status: DC | PRN

## 2016-08-18 MED ORDER — GLYCOPYRROLATE 0.2 MG/ML IJ SOLN
INTRAMUSCULAR | Status: DC | PRN
  Administered 2016-08-18: .2 mg via INTRAVENOUS

## 2016-08-18 MED ORDER — LACTATED RINGERS IV SOLN
INTRAVENOUS | Status: DC
  Administered 2016-08-18: 08:00:00 via INTRAVENOUS

## 2016-08-18 MED ORDER — LIDOCAINE HCL (CARDIAC) 20 MG/ML IV SOLN
INTRAVENOUS | Status: DC | PRN
  Administered 2016-08-18: 50 mg via INTRAVENOUS

## 2016-08-18 MED ORDER — PROPOFOL 10 MG/ML IV BOLUS
INTRAVENOUS | Status: DC | PRN
  Administered 2016-08-18 (×2): 100 mg via INTRAVENOUS
  Administered 2016-08-18: 50 mg via INTRAVENOUS
  Administered 2016-08-18: 100 mg via INTRAVENOUS

## 2016-08-18 MED ORDER — ACETAMINOPHEN 160 MG/5ML PO SOLN: 325.0000 mg | ORAL | Status: DC | PRN

## 2016-08-18 SURGICAL SUPPLY — 32 items
BALLN DILATOR 10-12 8 (BALLOONS)
BALLN DILATOR 12-15 8 (BALLOONS)
BALLN DILATOR 15-18 8 (BALLOONS)
BALLN DILATOR CRE 0-12 8 (BALLOONS)
BALLN DILATOR ESOPH 8 10 CRE (MISCELLANEOUS) IMPLANT
BALLOON DILATOR 12-15 8 (BALLOONS) IMPLANT
BALLOON DILATOR 15-18 8 (BALLOONS) IMPLANT
BALLOON DILATOR CRE 0-12 8 (BALLOONS) IMPLANT
BLOCK BITE 60FR ADLT L/F GRN (MISCELLANEOUS) ×2 IMPLANT
CANISTER SUCT 1200ML W/VALVE (MISCELLANEOUS) ×2 IMPLANT
CLIP HMST 235XBRD CATH ROT (MISCELLANEOUS) IMPLANT
CLIP RESOLUTION 360 11X235 (MISCELLANEOUS)
FCP ESCP3.2XJMB 240X2.8X (MISCELLANEOUS)
FORCEPS BIOP RAD 4 LRG CAP 4 (CUTTING FORCEPS) ×2 IMPLANT
FORCEPS BIOP RJ4 240 W/NDL (MISCELLANEOUS)
FORCEPS ESCP3.2XJMB 240X2.8X (MISCELLANEOUS) IMPLANT
GOWN CVR UNV OPN BCK APRN NK (MISCELLANEOUS) ×2 IMPLANT
GOWN ISOL THUMB LOOP REG UNIV (MISCELLANEOUS) ×2
INJECTOR VARIJECT VIN23 (MISCELLANEOUS) IMPLANT
KIT DEFENDO VALVE AND CONN (KITS) IMPLANT
KIT ENDO PROCEDURE OLY (KITS) ×2 IMPLANT
MARKER SPOT ENDO TATTOO 5ML (MISCELLANEOUS) IMPLANT
PAD GROUND ADULT SPLIT (MISCELLANEOUS) IMPLANT
RETRIEVER NET PLAT FOOD (MISCELLANEOUS) IMPLANT
SNARE SHORT THROW 13M SML OVAL (MISCELLANEOUS) IMPLANT
SNARE SHORT THROW 30M LRG OVAL (MISCELLANEOUS) IMPLANT
SPOT EX ENDOSCOPIC TATTOO (MISCELLANEOUS)
SYR INFLATION 60ML (SYRINGE) IMPLANT
TRAP ETRAP POLY (MISCELLANEOUS) IMPLANT
VARIJECT INJECTOR VIN23 (MISCELLANEOUS)
WATER STERILE IRR 250ML POUR (IV SOLUTION) ×2 IMPLANT
WIRE CRE 18-20MM 8CM F G (MISCELLANEOUS) IMPLANT

## 2016-08-18 NOTE — Anesthesia Postprocedure Evaluation (Signed)
Anesthesia Post Note  Patient: Brett Fields  Procedure(s) Performed: Procedure(s) (LRB): ESOPHAGOGASTRODUODENOSCOPY (EGD) WITH PROPOFOL (N/A)  Patient location during evaluation: PACU Anesthesia Type: MAC Level of consciousness: awake and alert and oriented Pain management: satisfactory to patient Vital Signs Assessment: post-procedure vital signs reviewed and stable Respiratory status: spontaneous breathing, nonlabored ventilation and respiratory function stable Cardiovascular status: blood pressure returned to baseline and stable Postop Assessment: Adequate PO intake and No signs of nausea or vomiting Anesthetic complications: no    Cherly BeachStella, [name]

## 2016-08-18 NOTE — Op Note (Signed)
[facility] Gastroenterology Patient Name: Brett Fields Procedure Date: 08/18/2016 8:29 AM MRN: [id number] Account #: [id number] Date of Birth: [birth year] Admit Type: Outpatient Age: 25 [facility] Gender: Male Note Status: Finalized Procedure:            Upper GI endoscopy Indications:          Dysphagia, Nausea, OTHER Providers:            Wyline MoodKiran Nakota Elsen MD, MD Referring MD:         Wyline MoodKiran Meline Russaw MD, MD (Referring MD) Medicines:            Monitored Anesthesia Care Complications:        No immediate complications. Procedure:            Pre-Anesthesia Assessment:                       - Prior to the procedure, a History and Physical was                        performed, and patient medications, allergies and                        sensitivities were reviewed. The patient's tolerance of                        previous anesthesia was reviewed.                       - ASA Grade Assessment: II - A patient with mild                        systemic disease.                       After obtaining informed consent, the endoscope was                        passed under direct vision. Throughout the procedure,                        the patient's blood pressure, pulse, and oxygen                        saturations were monitored continuously.The upper GI                        endoscopy was accomplished with ease. The patient                        tolerated the procedure well. The was introduced                        through the mouth, and advanced to the third part of                        duodenum. The upper GI endoscopy was accomplished with                        ease. The patient tolerated the procedure well. Findings:      The stomach was normal.  The examined duodenum was normal.      Mucosal changes including ringed esophagus were found in the upper third       of the esophagus. Esophageal findings were graded using the Eosinophilic       Esophagitis  Endoscopic Reference Score (EoE-EREFS) as: Edema Grade 0       Normal (distinct vascular markings), Rings Grade 1 Mild (subtle       circumferential ridges seen on esophageal distension), Exudates Grade 0       None (no white lesions seen), Furrows Grade 1 Present (vertical lines       with or without visible depth) and Stricture none (no stricture found).       Biopsies were obtained from the proximal and distal esophagus with cold       forceps for histology of suspected eosinophilic esophagitis. Impression:           - Esophageal mucosal changes suspicious for                        eosinophilic esophagitis. Biopsied. Recommendation:       - Return to my office as previously scheduled. Procedure Code(s):    --- Professional ---                       [phone], Esophagogastroduodenoscopy, flexible, transoral;                        with biopsy, single or multiple Diagnosis Code(s):    --- Professional ---                       R13.10, Dysphagia, unspecified CPT copyright 2016 American Medical Association. All rights reserved. The codes documented in this report are preliminary and upon coder review may  be revised to meet current compliance requirements. Wyline MoodKiran Batsheva Stevick, MD Wyline MoodKiran Willia Lampert MD, MD 08/18/2016 9:09:01 AM This report has been signed electronically. Number of Addenda: 0 Note Initiated On: 08/18/2016 8:29 AM Total Procedure Duration: 0 hours 8 minutes 25 seconds       [facility]

## 2016-08-18 NOTE — H&P (Signed)
  Wyline MoodKiran Brenan Mod[place] MD [address] [place], [place] [address] Phone: [phone] Fax : [phone]  Primary Care Physician:  No PCP Per Patient Primary Gastroenterologist:  Dr. [name]   Pre-Procedure History & Physical: HPI:  Brett Fields is a 25 y.o. male is here for an endoscopy.   Past Medical History:  Diagnosis Date  . Asthma   . GERD (gastroesophageal reflux disease)   . Orthodontics    permanent retainers, upper and lower    Past Surgical History:  Procedure Laterality Date  . INCISION AND DRAINAGE PERIRECTAL ABSCESS  04/06/2016   Procedure: IRRIGATION AND DEBRIDEMENT PERIRECTAL ABSCESS;  Surgeon: Kieth BrightlySeeplaputhur G Sankar, MD;  Location: [facility] ORS;  Service: General;;  . RECTAL EXAM UNDER ANESTHESIA N/A 04/06/2016   Procedure: RECTAL EXAM UNDER ANESTHESIA;  Surgeon: Kieth BrightlySeeplaputhur G Sankar, MD;  Location: [facility] ORS;  Service: General;  Laterality: N/A;  . TONSILLECTOMY    . WISDOM TOOTH EXTRACTION  2012    Prior to Admission medications   Medication Sig Start Date End Date Taking? Authorizing Provider  albuterol (PROVENTIL HFA;VENTOLIN HFA) 108 (90 Base) MCG/ACT inhaler Inhale into the lungs every 6 (six) hours as needed for wheezing or shortness of breath.   Yes Historical Provider, MD  omeprazole (PRILOSEC) 40 MG capsule TAKE ONE CAPSULE BY MOUTH EVERY DAY 30 MINUTES PRIOR TO EVENING MEAL 03/01/16  Yes Historical Provider, MD    Allergies as of 08/17/2016  . (No Known Allergies)    History reviewed. No pertinent family history.  Social History   Social History  . Marital status: Married    Spouse name: N/A  . Number of children: N/A  . Years of education: N/A   Occupational History  . Not on file.   Social History Main Topics  . Smoking status: Never Smoker  . Smokeless tobacco: Never Used  . Alcohol use 0.0 oz/week     Comment: 7 glasses wine/month  . Drug use: No  . Sexual activity: Not on file   Other Topics Concern  . Not on file    Social History Narrative  . No narrative on file    Review of Systems: See HPI, otherwise negative ROS  Physical Exam: BP 119/69   Pulse 68   Temp 97.6 F (36.4 C) (Temporal)   Ht 5\' 10"  (1.778 m)   Wt 201 lb (91.2 kg)   SpO2 98%   BMI 28.84 kg/m  General:   Alert,  pleasant and cooperative in NAD Head:  Normocephalic and atraumatic. Neck:  Supple; no masses or thyromegaly. Lungs:  Clear throughout to auscultation.    Heart:  Regular rate and rhythm. Abdomen:  Soft, nontender and nondistended. Normal bowel sounds, without guarding, and without rebound.   Neurologic:  Alert and  oriented x4;  grossly normal neurologically.  Impression/Plan: Brett Fields is here for an endoscopy to be performed for dysphagia  Risks, benefits, limitations, and alternatives regarding  endoscopy have been reviewed with the patient.  Questions have been answered.  All parties agreeable.   Wyline MoodKiran Kienan Doublin, MD  08/18/2016, 8:43 AM

## 2016-08-18 NOTE — Anesthesia Procedure Notes (Signed)
Procedure Name: MAC Performed by: Roberts Bon Pre-anesthesia Checklist: Patient identified, Emergency Drugs available, Suction available, Timeout performed and Patient being monitored Patient Re-evaluated:Patient Re-evaluated prior to inductionOxygen Delivery Method: Nasal cannula Placement Confirmation: positive ETCO2     

## 2016-08-18 NOTE — Anesthesia Preprocedure Evaluation (Signed)
Anesthesia Evaluation  Patient identified by MRN, date of birth, ID band Patient awake    Reviewed: Allergy & Precautions, H&P , NPO status , Patient's Chart, lab work & pertinent test results  Airway Mallampati: II  TM Distance: >3 FB Neck ROM: full    Dental no notable dental hx.    Pulmonary asthma ,    Pulmonary exam normal        Cardiovascular Normal cardiovascular exam     Neuro/Psych    GI/Hepatic GERD  ,  Endo/Other    Renal/GU      Musculoskeletal   Abdominal   Peds  Hematology   Anesthesia Other Findings   Reproductive/Obstetrics                             Anesthesia Physical Anesthesia Plan  ASA: II  Anesthesia Plan: MAC   Post-op Pain Management:    Induction:   Airway Management Planned:   Additional Equipment:   Intra-op Plan:   Post-operative Plan:   Informed Consent: I have reviewed the patients History and Physical, chart, labs and discussed the procedure including the risks, benefits and alternatives for the proposed anesthesia with the patient or authorized representative who has indicated his/her understanding and acceptance.     Plan Discussed with:   Anesthesia Plan Comments:         Anesthesia Quick Evaluation  

## 2016-08-18 NOTE — Discharge Instructions (Signed)

## 2016-08-18 NOTE — Transfer of Care (Signed)
Immediate Anesthesia Transfer of Care Note  Patient: Brett Fields  Procedure(s) Performed: Procedure(s): ESOPHAGOGASTRODUODENOSCOPY (EGD) WITH PROPOFOL (N/A)  Patient Location: PACU  Anesthesia Type: MAC  Level of Consciousness: awake, alert  and patient cooperative  Airway and Oxygen Therapy: Patient Spontanous Breathing and Patient connected to supplemental oxygen  Post-op Assessment: Post-op Vital signs reviewed, Patient's Cardiovascular Status Stable, Respiratory Function Stable, Patent Airway and No signs of Nausea or vomiting  Post-op Vital Signs: Reviewed and stable  Complications: No apparent anesthesia complications

## 2016-08-19 ENCOUNTER — Encounter: Payer: Self-pay | Admitting: Gastroenterology

## 2016-08-30 ENCOUNTER — Ambulatory Visit: Payer: Self-pay | Admitting: Physician Assistant

## 2016-08-30 ENCOUNTER — Encounter: Payer: Self-pay | Admitting: Physician Assistant

## 2016-08-30 VITALS — BP 109/65 | HR 85 | Temp 98.3°F

## 2016-08-30 DIAGNOSIS — J029 Acute pharyngitis, unspecified: Secondary | ICD-10-CM

## 2016-08-30 LAB — POCT RAPID STREP A (OFFICE): Rapid Strep A Screen: NEGATIVE

## 2016-08-30 NOTE — Progress Notes (Signed)
S: c/o sore throat, no fever but ?if he had one last night, no cough or congestion, no sinus or ear pain, no v/d, wife had similar sx last week and teaches in an elementary school, he has no known exposure to strep  O: vitals wnl, nad, tms clear, throat a little red, neck supple no lymph, lungs c t a, cv rrr, q strep neg  A: acute viral pharyngitis  P: otc measures, if worsening call and will call in an antibiotic, explained viral infection to pt

## 2016-09-07 ENCOUNTER — Encounter: Payer: Self-pay | Admitting: Gastroenterology

## 2016-09-08 ENCOUNTER — Ambulatory Visit: Payer: Managed Care, Other (non HMO) | Admitting: Gastroenterology

## 2016-09-14 ENCOUNTER — Ambulatory Visit (INDEPENDENT_AMBULATORY_CARE_PROVIDER_SITE_OTHER): Payer: Managed Care, Other (non HMO) | Admitting: Gastroenterology

## 2016-09-14 ENCOUNTER — Encounter: Payer: Self-pay | Admitting: Gastroenterology

## 2016-09-14 VITALS — BP 135/85 | HR 71 | Temp 98.3°F | Ht 70.0 in | Wt 203.2 lb

## 2016-09-14 DIAGNOSIS — K200 Eosinophilic esophagitis: Secondary | ICD-10-CM

## 2016-09-14 NOTE — Patient Instructions (Signed)
Please see the information provided regarding Eosinophilic Esophagitis.  We will see you back in 3 months for a follow-up. Please call to make an appointment in February for our March Calendar.

## 2016-09-14 NOTE — Progress Notes (Signed)
   Primary Care Physician: No PCP Per Patient  Primary Gastroenterologist:  Dr. Wyline MoodKiran Kregg Cihlar   Chief Complaint  Patient presents with  . Follow-up    EGD Results    HPI: Brett Fields is a 25 y.o. male here today to follow up to his initial visit when he was seen on 08/17/16 for dysphagia. First episoide was in February 2017 , went to an ENT and had a "camera test" and was prescribed omeprazole , he took omeprazole  For a few months and it relieved the issues with swallowing, then stopped and the issue began to recur in September 2017 . He was symptomatic despite being on a PPI  Interval history 08/17/16-09/14/16  EGD-Esophagus showed features of EOE and esophageal biopsies confirmed eosinophils > 15 phpf.  Says since last visit he has absolutely no issues with swallowing and is doing well.   Current Outpatient Prescriptions  Medication Sig Dispense Refill  . albuterol (PROVENTIL HFA;VENTOLIN HFA) 108 (90 Base) MCG/ACT inhaler Inhale into the lungs every 6 (six) hours as needed for wheezing or shortness of breath.    . Omeprazole 20 MG TBEC TAKE ONE CAPSULE BY MOUTH EVERY DAY 30 MINUTES PRIOR TO EVENING MEAL  12   No current facility-administered medications for this visit.     Allergies as of 09/14/2016  . (No Known Allergies)    ROS:  General: Negative for anorexia, weight loss, fever, chills, fatigue, weakness. ENT: Negative for hoarseness, difficulty swallowing , nasal congestion. CV: Negative for chest pain, angina, palpitations, dyspnea on exertion, peripheral edema.  Respiratory: Negative for dyspnea at rest, dyspnea on exertion, cough, sputum, wheezing.  GI: See history of present illness. GU:  Negative for dysuria, hematuria, urinary incontinence, urinary frequency, nocturnal urination.  Endo: Negative for unusual weight change.    Physical Examination:   BP 135/85   Pulse 71   Temp 98.3 F (36.8 C) (Oral)   Ht 5\' 10"  (1.778 m)   Wt 203 lb 3.2 oz (92.2 kg)    BMI 29.16 kg/m   General: Well-nourished, well-developed in no acute distress.  Eyes: No icterus. Conjunctivae pink. Mouth: Oropharyngeal mucosa moist and pink , no lesions erythema or exudate. Lungs: Clear to auscultation bilaterally. Non-labored. Heart: Regular rate and rhythm, no murmurs rubs or gallops.  Abdomen: Bowel sounds are normal, nontender, nondistended, no hepatosplenomegaly or masses, no abdominal bruits or hernia , no rebound or guarding.   Psych: Alert and cooperative, normal mood and affect.  Imaging Studies: No results found.  Assessment and Plan:   Brett Fields is a 25 y.o. y/o male here today for follow up . His endoscopy, biopsies and history is very suggestive of eosinophilic esophagitis. He says that he only has issues with swallowing when he consumes [place] food. I explained the 6 groups of foods in general that can trigger this condition. I discussed treatment options including swallowed steroids, food elimination diet as well as referral for allergy testing . He was pretty clear that he absolutely didn't want any of these options presently and would like to stay off all [place] food and see how it goes. Explained we can pursue this route with plan to repeat EGD in 3-4 months to check for healing . I will bring him back to my office in 3 months to follow up .   Provided printouts from [employer] on eosinophilic esophagitis patient information.  Dr Wyline MoodKiran Jenet Durio  MD

## 2016-09-17 ENCOUNTER — Telehealth: Payer: Self-pay

## 2016-09-17 NOTE — Telephone Encounter (Signed)
Pt had a follow up appt on 09/14/16 to discuss results.

## 2016-09-17 NOTE — Telephone Encounter (Signed)
-----   Message from Wyline MoodKiran Anna, MD sent at 09/08/2016  3:06 PM EST ----- He very likely has eosinophilic esophagitis- suggest office visit to discuss results and treatment

## 2016-11-08 IMAGING — CR DG NECK SOFT TISSUE
1 series · 3 of 3 positions shown · non-contrast
Comparison: None.

CLINICAL DATA: Foreign body sensation in throat region.

EXAM:
NECK SOFT TISSUES - 1+ VIEW

[Series 1: dg neck soft tissue · 0.14mm/px · 3 of 3 slices shown]
[im 1/3]
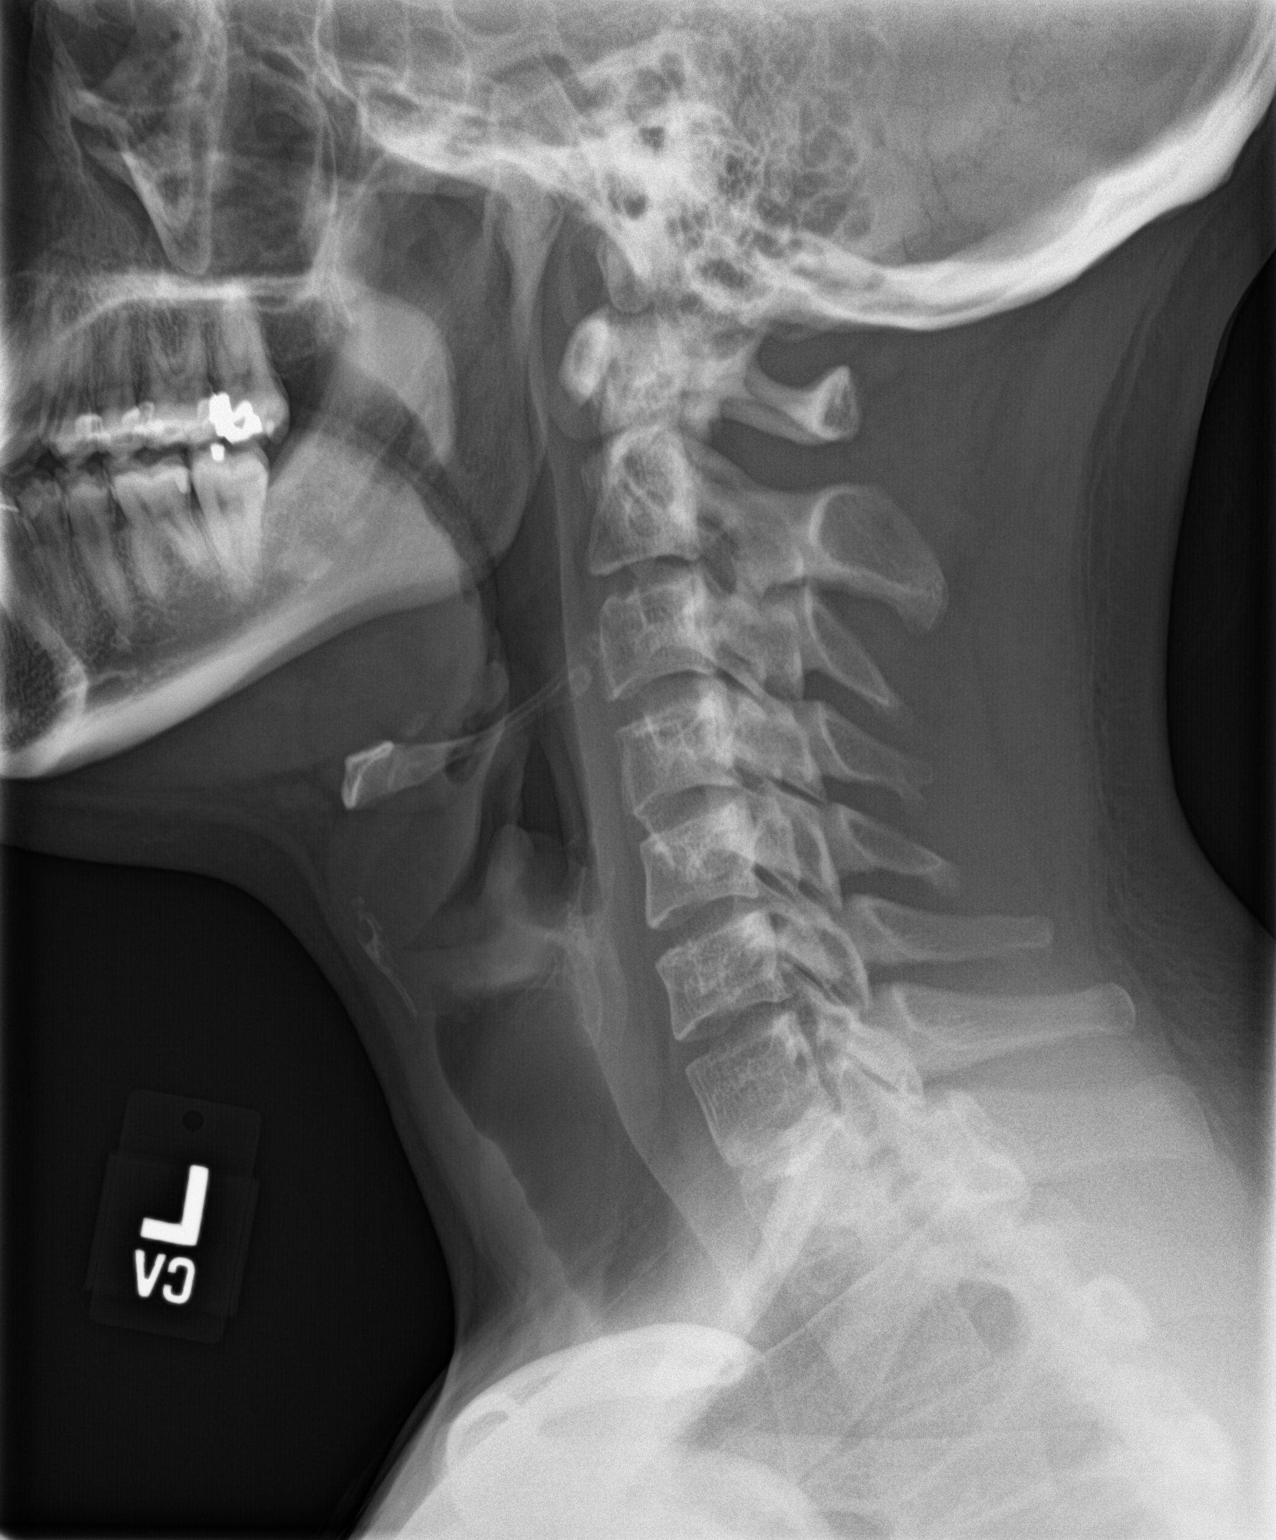
[im 2/3]
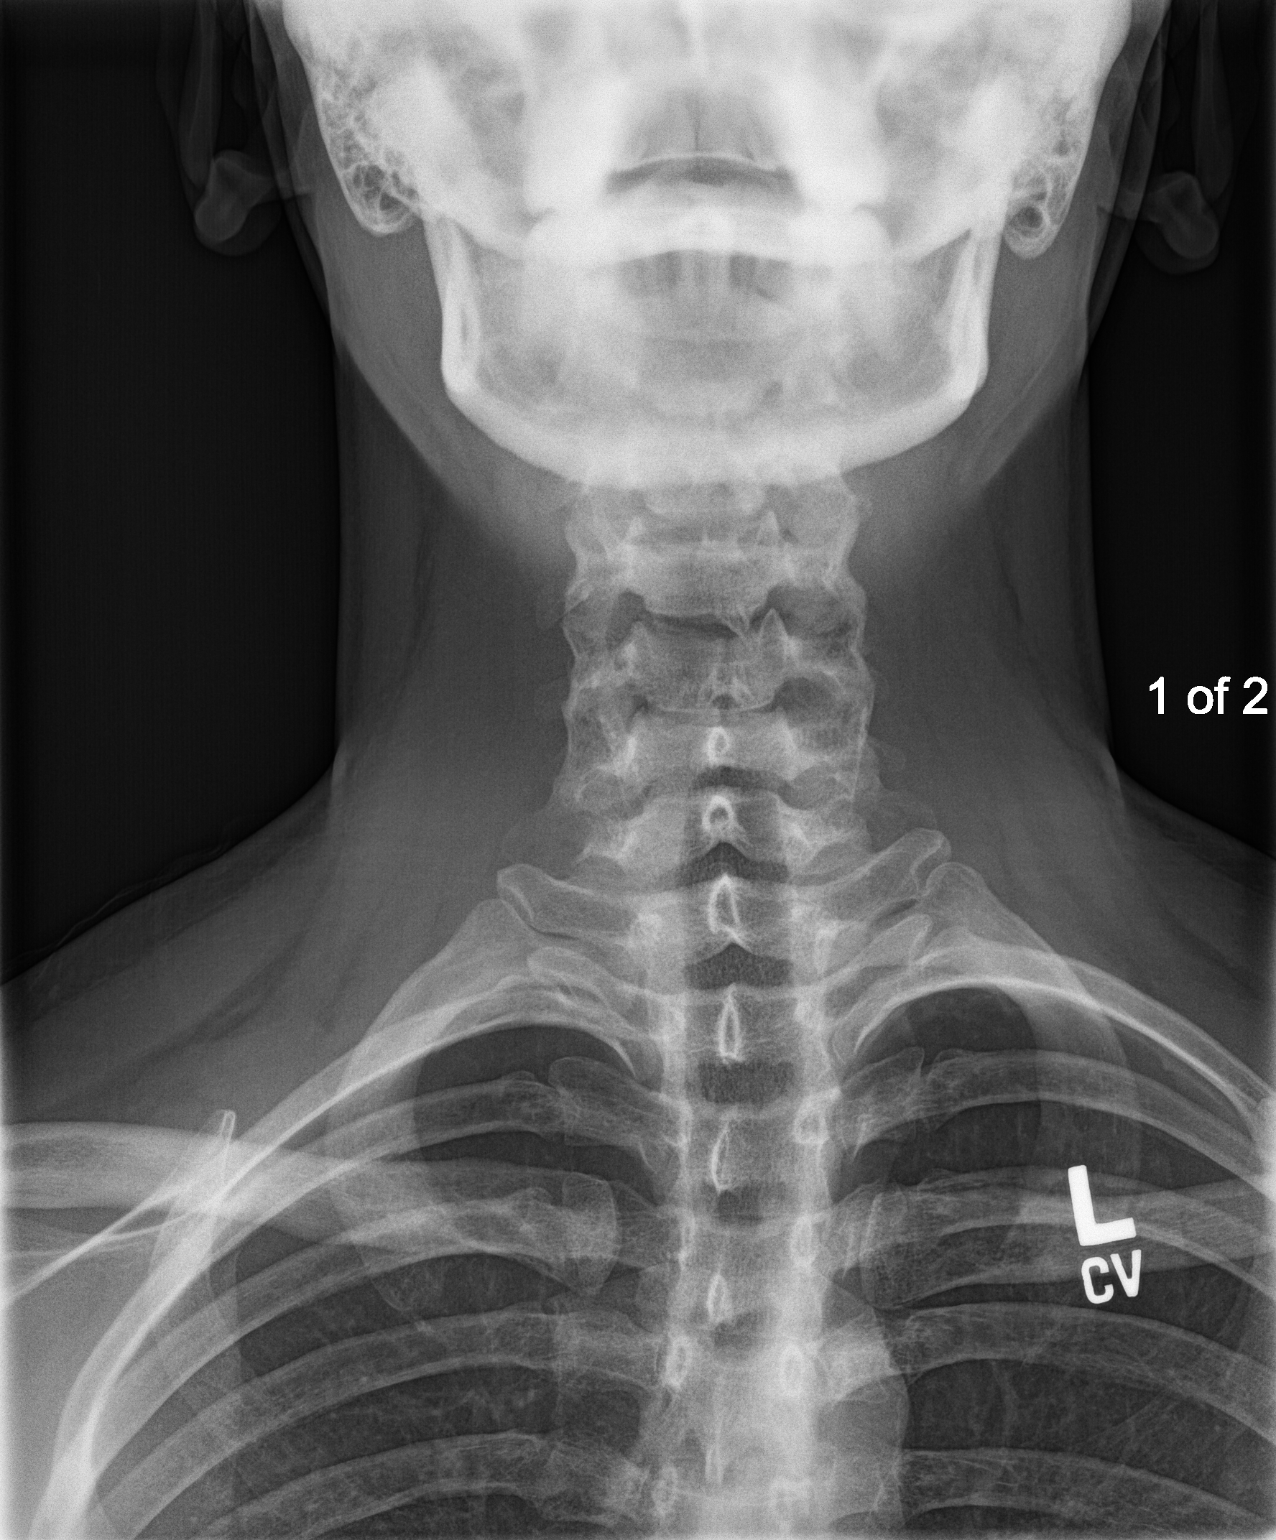
[im 3/3]
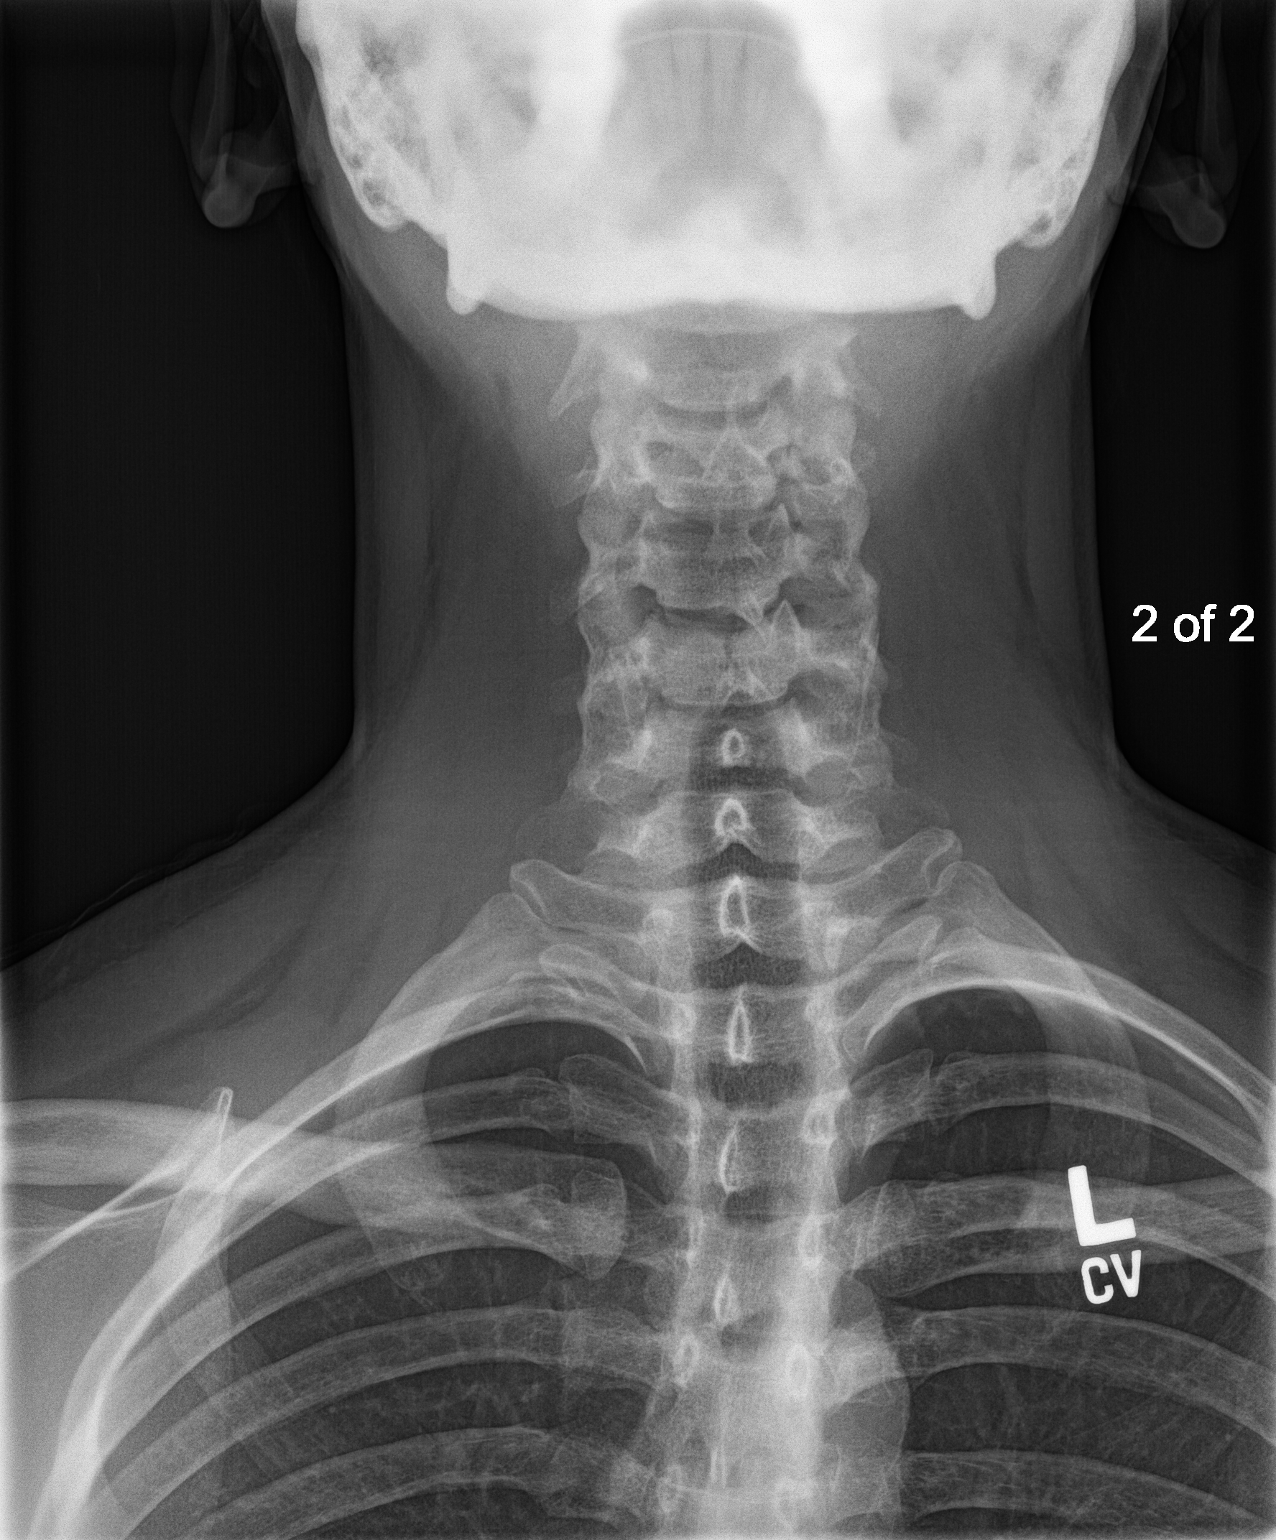

[3 of 3 positions shown; findings below may reference images not displayed]

FINDINGS: Frontal and lateral views were obtained. No radiopaque foreign body
appreciable. The epiglottis and aryepiglottic fold regions appear
normal. Prevertebral soft tissues are normal. There is no air-fluid
level suggesting abscess. The bony structures appear normal. Tongue
base region appears normal. Tonsils and adenoidal structures appear
unremarkable.
IMPRESSION: No abnormality apparent radiographically. If there remains concern
for potential pathology in the neck region, contrast enhanced CT
would be the imaging study of choice to further evaluate.

## 2017-03-10 ENCOUNTER — Ambulatory Visit: Payer: Self-pay | Admitting: Family

## 2017-03-10 VITALS — BP 110/70 | HR 98 | Temp 98.5°F

## 2017-03-10 DIAGNOSIS — Z0000 Encounter for general adult medical examination without abnormal findings: Secondary | ICD-10-CM

## 2017-03-10 NOTE — Progress Notes (Signed)
S/ 26 y/o WMM in for wellness exam. Hx of UGI complaints ,scoped and treated with prilosec . He is now taking otc regularly due to sxs reoccurance without meds. Denies red flags.  Trying to eat healthier due to wt gain O/ VSS alert pleasant ENT wnl, neck supple, heart rsr lungs clear Abd soft nontender, no mass, megally  Ext wnl A/ Wellness exam P heatlhy lifestyles encouraged. Advised to f/u with GI re continued sxs.

## 2018-09-21 ENCOUNTER — Ambulatory Visit: Payer: Self-pay | Admitting: Emergency Medicine

## 2018-09-21 VITALS — BP 122/82 | HR 102 | Temp 98.2°F | Resp 14 | Ht 70.0 in | Wt 215.0 lb

## 2018-09-21 DIAGNOSIS — H10023 Other mucopurulent conjunctivitis, bilateral: Secondary | ICD-10-CM

## 2018-09-21 DIAGNOSIS — J0100 Acute maxillary sinusitis, unspecified: Secondary | ICD-10-CM

## 2018-09-21 MED ORDER — AMOXICILLIN 875 MG PO TABS: ORAL_TABLET | ORAL | 0 refills | Status: DC

## 2018-09-21 MED ORDER — POLYMYXIN B-TRIMETHOPRIM 10000-0.1 UNIT/ML-% OP SOLN: OPHTHALMIC | 0 refills | Status: AC

## 2018-09-21 NOTE — Progress Notes (Signed)
[facility]   Patient ID: Brett Fields DOB: 27 y.o. MRN: [id number]   SINUSITIS Onset: 7 days Facial/sinus pressure with discolored nasal mucus.    Severity: moderate Eye symptoms: Matted eye yellow discharge bilaterally, worse on the right Tried OTC meds without significant relief.  Symptoms:  + Low-grade fever  + URI prodrome with nasal congestion + Minimal swollen neck glands + mild right sinus Headache + mild bilateral ear pressure  No Allergy symptoms, but he has seasonal allergic rhinitis, worse in the early fall No significant Sore Throat      No significant Cough No chest pain No shortness of breath  No wheezing  No Abdominal Pain No Nausea No Vomiting No diarrhea  No Myalgias No focal neurologic symptoms No syncope No Rash  No Urinary symptoms  Remainder of Review of Systems negative except as noted in the HPI.   Physical Exam: Constitutional: Oriented to person, place, and time. Appears well-developed and well-nourished. No distress.  HENT:  Head: Normocephalic and atraumatic.  Right Ear: Tympanic membrane, external ear and ear canal normal.  Left Ear: Tympanic membrane, external ear and ear canal normal.  Nose: Mucosal edema and rhinorrhea present. Right sinus exhibits maxillary sinus tenderness. Left sinus exhibits maxillary sinus tenderness.  Mouth/Throat: Oropharynx is clear and moist. No oral lesions. No oropharyngeal exudate.  Eyes: Visual acuity grossly intact.  No scleral icterus.  Conjunctiva both eyes red and injected with slight yellow discharge.-Right worse than left. No foreign body or abrasion seen.  Anterior chambers normal. Neck: Neck supple.  Cardiovascular: Normal rate, regular rhythm and normal heart sounds.  Pulmonary/Chest: Effort normal and breath sounds normal.  No rhonchi. No wheezes.  No rales.  Lymphadenopathy:  No cervical adenopathy.  Neurological: Alert and oriented to  person, place, and time.  Skin: Skin is warm and dry.  Nursing note and vitals reviewed.  Blood pressure 122/82, pulse (!) 102, temperature 98.2 F (36.8 C), temperature source Oral, resp. rate 14, height 5\' 10"  (1.778 m), weight 215 lb (97.5 kg), SpO2 97 %. Pulse rechecked by me, 88 and regular  Assessment:  Acute sinusitis Bilateral conjunctivitis, likely bacterial, right worse than left.   Plan:  Treatment options discussed, as well as risks, benefits, alternatives. Patient voiced understanding and agreement with the following plans:  New Prescriptions   AMOXICILLIN (AMOXIL) 875 MG TABLET    Take 1 twice a day X 10 days.   TRIMETHOPRIM-POLYMYXIN B (POLYTRIM) OPHTHALMIC SOLUTION    2 drop in affected eye(s) every 4 hours (while awake) x 7 days  OTC Flonase.-1 to 2 sprays each nostril twice daily. Other symptomatic care discussed.  May continue Sudafed as he has not noted any significant side effects and it helps his nasal congestion.  May use OTC antihistamines but be careful of over drying and other side effects.  Follow-up with your primary care doctor or eye doctor in 5-7 days if not improving, or sooner if symptoms become worse. Precautions discussed. Red flags discussed. Questions invited and answered. Patient voiced understanding and agreement.

## 2019-06-11 ENCOUNTER — Other Ambulatory Visit: Payer: Self-pay

## 2019-06-11 ENCOUNTER — Encounter: Payer: Self-pay | Admitting: Adult Health

## 2019-06-11 ENCOUNTER — Ambulatory Visit: Payer: Managed Care, Other (non HMO) | Admitting: Adult Health

## 2019-06-11 VITALS — BP 104/68 | HR 71 | Temp 98.1°F | Resp 16 | Ht 70.0 in | Wt 214.0 lb

## 2019-06-11 DIAGNOSIS — Z008 Encounter for other general examination: Secondary | ICD-10-CM

## 2019-06-11 DIAGNOSIS — Z0189 Encounter for other specified special examinations: Secondary | ICD-10-CM

## 2019-06-11 NOTE — Patient Instructions (Signed)
The Biometric exam is a brief physical with labs including glucose and cholesterol. This does not replace a full physical with a primary care provider, and additional recommended labs. This is an acute care clinic not for maintenance of chronic or long standing conditions.   Provider also recommends if you do not have a primary care provider for patient to establish care promptly.You can choose any provider of your choice at any facility of your choice, the below information is  just a resource to aid in you finding a primary care provider for routine health maintenance.   [facility]  PHYSICIAN/PROVIDER  REFERRAL LINE at [phone]  [email or web address] to help assist with finding a primary care doctor.   Helpful resources below of other primary care office's accepting new patients.   . [facility]         [address]  [place]. [place] [address] [phone]  . [facility]    [address], [address] [place], [place] [address] [phone]  . [facility] [address]  [place], [place]  [phone]   . [facility]  [address] [place] [address] [phone]    Follow up with primary care as needed for chronic and maintenance health care- can be seen in this employee clinic for acute care.     Health Maintenance, Male Adopting a healthy lifestyle and getting preventive care are important in promoting health and wellness. Ask your health care provider about:  The right schedule for you to have regular tests and exams.  Things you can do on your own to prevent diseases and keep yourself healthy. What should I know about diet, weight, and exercise? Eat a healthy diet   Eat a diet that includes plenty of vegetables, fruits, low-fat dairy products, and lean protein.  Do not eat a lot of foods that are high in solid fats, added sugars, or sodium.  Maintain a healthy weight Body mass index (BMI) is a measurement that can be used to identify possible weight problems. It estimates body fat based on height and weight. Your health care provider can help determine your BMI and help you achieve or maintain a healthy weight. Get regular exercise Get regular exercise. This is one of the most important things you can do for your health. Most adults should:  Exercise for at least 150 minutes each week. The exercise should increase your heart rate and make you sweat (moderate-intensity exercise).  Do strengthening exercises at least twice a week. This is in addition to the moderate-intensity exercise.  Spend less time sitting. Even light physical activity can be beneficial. Watch cholesterol and blood lipids Have your blood tested for lipids and cholesterol at 27 years of age, then have this test every 5 years. You may need to have your cholesterol levels checked more often if:  Your lipid or cholesterol levels are high.  You are older than 27 years of age.  You are at high risk for heart disease. What should I know about cancer screening? Many types of cancers can be detected early and may often be prevented. Depending on your health history and family history, you may need to have cancer screening at various ages. This may include screening for:  Colorectal cancer.  Prostate cancer.  Skin cancer.  Lung cancer. What should I know about heart disease, diabetes, and high blood pressure? Blood pressure and heart disease    High blood pressure causes heart disease and increases the risk of stroke. This is more likely to develop in people who have high blood pressure readings, are of African descent, or are overweight.  Talk with your health care provider about your target blood pressure readings.  Have your blood pressure checked: ? Every 3-5 years if you are 18-39 years of age. ? Every year if you are 40 years old or older.  If you  are between the ages of 65 and 75 and are a current or former smoker, ask your health care provider if you should have a one-time screening for abdominal aortic aneurysm (AAA). Diabetes Have regular diabetes screenings. This checks your fasting blood sugar level. Have the screening done:  Once every three years after age 45 if you are at a normal weight and have a low risk for diabetes.  More often and at a younger age if you are overweight or have a high risk for diabetes. What should I know about preventing infection? Hepatitis B If you have a higher risk for hepatitis B, you should be screened for this virus. Talk with your health care provider to find out if you are at risk for hepatitis B infection. Hepatitis C Blood testing is recommended for:  Everyone born from 1945 through 1965.  Anyone with known risk factors for hepatitis C. Sexually transmitted infections (STIs)  You should be screened each year for STIs, including gonorrhea and chlamydia, if: ? You are sexually active and are younger than 27 years of age. ? You are older than 27 years of age and your health care provider tells you that you are at risk for this type of infection. ? Your sexual activity has changed since you were last screened, and you are at increased risk for chlamydia or gonorrhea. Ask your health care provider if you are at risk.  Ask your health care provider about whether you are at high risk for HIV. Your health care provider may recommend a prescription medicine to help prevent HIV infection. If you choose to take medicine to prevent HIV, you should first get tested for HIV. You should then be tested every 3 months for as long as you are taking the medicine. Follow these instructions at home: Lifestyle  Do not use any products that contain nicotine or tobacco, such as cigarettes, e-cigarettes, and chewing tobacco. If you need help quitting, ask your health care provider.  Do not use street drugs.  Do  not share needles.  Ask your health care provider for help if you need support or information about quitting drugs. Alcohol use  Do not drink alcohol if your health care provider tells you not to drink.  If you drink alcohol: ? Limit how much you have to 0-2 drinks a day. ? Be aware of how much alcohol is in your drink. In the [place] one drink equals one 12 oz bottle of beer (355 mL), one 5 oz glass of wine (148 mL), or one 1 oz glass of hard liquor (44 mL). General instructions  Schedule regular health, dental, and eye exams.  Stay current with your vaccines.  Tell your health care provider if: ? You often feel depressed. ? You have ever been abused or do not feel safe at home. Summary  Adopting a healthy lifestyle and getting preventive care are important in promoting health and wellness.  Follow your health care provider's instructions about healthy diet, exercising, and getting tested or screened for diseases.  Follow   your health care provider's instructions on monitoring your cholesterol and blood pressure. This information is not intended to replace advice given to you by your health care provider. Make sure you discuss any questions you have with your health care provider. Document Released: 04/01/2008 Document Revised: 09/27/2018 Document Reviewed: 09/27/2018 Elsevier Patient Education  2020 Elsevier Inc.  

## 2019-06-11 NOTE — Progress Notes (Signed)
Brett Fields DOB: 28 y.o. MRN: Fields  Subjective:  Here for Biometric Screen/brief exam  Patient is a 28 year old male in no acute distress who comes to the clinic for his biometric brief exam, and screening. He reports he feels well. He exercises. He has a primary care he saw two years ago.  He tries to eat healthy. He has been working at home since the covid pandemic and is enjoying it.   Patient  denies any fever, body aches,chills, rash, chest pain, shortness of breath, nausea, vomiting, or diarrhea.   Objective: Blood pressure 104/68, pulse 71, temperature 98.1 F (36.7 C), temperature source Temporal, resp. rate 16, height 5\' 10"  (1.778 m), weight 214 lb (97.1 kg), SpO2 98 %. NAD, well developed well nourished. Skin slightly pale  HEENT: Within normal limits Neck: Normal, supple, no lymphadenopathy  Heart: Regular rate and rhythm Lungs: Clear to auscultation without any adventitious lung sounds    Assessment: Biometric screen Encounter for other general examination- brief exam- biometric screening- not a full annual physical  - Plan: Lipid Panel With LDL/HDL Ratio, Glucose, random  Encounter for biometric screening - Plan: Lipid Panel With LDL/HDL Ratio, Glucose, random    Plan: Orders Placed This Encounter  Procedures  . Lipid Panel With LDL/HDL Ratio  . Glucose, random    Fasting glucose and lipids. Discussed with patient that today's visit here is a limited biometric screening visit (not a comprehensive exam or management of any chronic problems) Discussed some health issues, including healthy eating habits and exercise. Encouraged to follow-up with PCP for annual comprehensive preventive and wellness care (and if applicable, any chronic issues). Questions invited and answered.  The Biometric exam is a brief physical with labs including glucose and cholesterol. This does not replace a full physical  with a primary care provider, and additional recommended labs. This is an acute care clinic not for maintenance of chronic or long standing conditions.   Provider also recommends if you do not have a primary care provider for patient to establish care promptly.You can choose any provider of your choice at any facility of your choice, the below information is  just a resource to aid in you finding a primary care provider for routine health maintenance.   [facility]  PHYSICIAN/PROVIDER  REFERRAL LINE at [phone]  [email or web address] to help assist with finding a primary care doctor.   Helpful resources below of other primary care office's accepting new patients.   [facility]         [address]  [place]. [place] [address] [phone]  . [facility]    [address], [place], [place] [address] [phone]  . [facility] [address] [place], [place]  [phone]   . [facility]  [address] [place]  [address] [phone]    Follow up with primary care as needed for chronic and maintenance health care- can be seen in this employee clinic for acute care.    Orders Placed This Encounter  Procedures  . Lipid Panel With LDL/HDL Ratio  . Glucose, random

## 2019-06-12 ENCOUNTER — Encounter: Payer: Self-pay | Admitting: Adult Health

## 2019-06-12 LAB — COMPREHENSIVE METABOLIC PANEL
ALT: 21 IU/L (ref 0–44)
AST: 17 IU/L (ref 0–40)
Albumin/Globulin Ratio: 2.4 — ABNORMAL HIGH (ref 1.2–2.2)
Albumin: 4.6 g/dL (ref 4.1–5.2)
Alkaline Phosphatase: 57 IU/L (ref 39–117)
BUN/Creatinine Ratio: 10 (ref 9–20)
BUN: 8 mg/dL (ref 6–20)
Bilirubin Total: 0.4 mg/dL (ref 0.0–1.2)
CO2: 21 mmol/L (ref 20–29)
Calcium: 9.3 mg/dL (ref 8.7–10.2)
Chloride: 104 mmol/L (ref 96–106)
Creatinine, Ser: 0.78 mg/dL (ref 0.76–1.27)
GFR calc Af Amer: 144 mL/min/{1.73_m2} (ref >59)
GFR calc non Af Amer: 125 mL/min/{1.73_m2} (ref >59)
Globulin, Total: 1.9 g/dL (ref 1.5–4.5)
Glucose: 90 mg/dL (ref 65–99)
Potassium: 4.5 mmol/L (ref 3.5–5.2)
Sodium: 141 mmol/L (ref 134–144)
Total Protein: 6.5 g/dL (ref 6.0–8.5)

## 2019-06-12 LAB — CBC WITH DIFFERENTIAL/PLATELET
Basophils Absolute: 0 10*3/uL (ref 0.0–0.2)
Basos: 1 %
EOS (ABSOLUTE): 0.1 10*3/uL (ref 0.0–0.4)
Eos: 2 %
Hematocrit: 49 % (ref 37.5–51.0)
Hemoglobin: 17 g/dL (ref 13.0–17.7)
Immature Grans (Abs): 0 10*3/uL (ref 0.0–0.1)
Immature Granulocytes: 0 %
Lymphocytes Absolute: 1.6 10*3/uL (ref 0.7–3.1)
Lymphs: 31 %
MCH: 28.7 pg (ref 26.6–33.0)
MCHC: 34.7 g/dL (ref 31.5–35.7)
MCV: 83 fL (ref 79–97)
Monocytes Absolute: 0.5 10*3/uL (ref 0.1–0.9)
Monocytes: 10 %
Neutrophils Absolute: 3 10*3/uL (ref 1.4–7.0)
Neutrophils: 56 %
Platelets: 300 10*3/uL (ref 150–450)
RBC: 5.92 x10E6/uL — ABNORMAL HIGH (ref 4.14–5.80)
RDW: 12.6 % (ref 11.6–15.4)
WBC: 5.2 10*3/uL (ref 3.4–10.8)

## 2019-06-12 LAB — TSH: TSH: 1.22 u[IU]/mL (ref 0.450–4.500)

## 2019-06-19 ENCOUNTER — Other Ambulatory Visit: Payer: Self-pay

## 2019-06-19 ENCOUNTER — Other Ambulatory Visit: Payer: Managed Care, Other (non HMO)

## 2019-06-19 DIAGNOSIS — Z23 Encounter for immunization: Secondary | ICD-10-CM

## 2019-06-19 DIAGNOSIS — Z0189 Encounter for other specified special examinations: Secondary | ICD-10-CM

## 2019-06-19 DIAGNOSIS — Z008 Encounter for other general examination: Secondary | ICD-10-CM | POA: Diagnosis not present

## 2019-06-19 NOTE — Patient Instructions (Signed)
https://www.cdc.gov/vaccines/hcp/vis/vis-statements/tdap.pdf">  Tdap Vaccine (Tetanus, Diphtheria and Pertussis): What You Need to Know 1. Why get vaccinated? Tetanus, diphtheria and pertussis are very serious diseases. Tdap vaccine can protect us from these diseases. And, Tdap vaccine given to pregnant women can protect newborn babies against pertussis.. TETANUS (Lockjaw) is rare in the [place] today. It causes painful muscle tightening and stiffness, usually all over the body.  It can lead to tightening of muscles in the head and neck so you can't open your mouth, swallow, or sometimes even breathe. Tetanus kills about 1 out of 10 people who are infected even after receiving the best medical care. DIPHTHERIA is also rare in the [place] today. It can cause a thick coating to form in the back of the throat.  It can lead to breathing problems, heart failure, paralysis, and death. PERTUSSIS (Whooping Cough) causes severe coughing spells, which can cause difficulty breathing, vomiting and disturbed sleep.  It can also lead to weight loss, incontinence, and rib fractures. Up to 2 in 100 adolescents and 5 in 100 adults with pertussis are hospitalized or have complications, which could include pneumonia or death. These diseases are caused by bacteria. Diphtheria and pertussis are spread from person to person through secretions from coughing or sneezing. Tetanus enters the body through cuts, scratches, or wounds. Before vaccines, as many as 200,000 cases of diphtheria, 200,000 cases of pertussis, and hundreds of cases of tetanus, were reported in the [place] each year. Since vaccination began, reports of cases for tetanus and diphtheria have dropped by about 99% and for pertussis by about 80%. 2. Tdap vaccine Tdap vaccine can protect adolescents and adults from tetanus, diphtheria, and pertussis. One dose of Tdap is routinely given at age 11 or 12. People who did not get Tdap at that age  should get it as soon as possible. Tdap is especially important for healthcare professionals and anyone having close contact with a baby younger than 12 months. Pregnant women should get a dose of Tdap during every pregnancy, to protect the newborn from pertussis. Infants are most at risk for severe, life-threatening complications from pertussis. Another vaccine, called Td, protects against tetanus and diphtheria, but not pertussis. A Td booster should be given every 10 years. Tdap may be given as one of these boosters if you have never gotten Tdap before. Tdap may also be given after a severe cut or burn to prevent tetanus infection. Your doctor or the person giving you the vaccine can give you more information. Tdap may safely be given at the same time as other vaccines. 3. Some people should not get this vaccine  A person who has ever had a life-threatening allergic reaction after a previous dose of any diphtheria, tetanus or pertussis containing vaccine, OR has a severe allergy to any part of this vaccine, should not get Tdap vaccine. Tell the person giving the vaccine about any severe allergies.  Anyone who had coma or long repeated seizures within 7 days after a childhood dose of DTP or DTaP, or a previous dose of Tdap, should not get Tdap, unless a cause other than the vaccine was found. They can still get Td.  Talk to your doctor if you: ? have seizures or another nervous system problem, ? had severe pain or swelling after any vaccine containing diphtheria, tetanus or pertussis, ? ever had a condition called Guillain-Barr Syndrome (GBS), ? aren't feeling well on the day the shot is scheduled. 4. Risks With any medicine, including vaccines,   there is a chance of side effects. These are usually mild and go away on their own. Serious reactions are also possible but are rare. Most people who get Tdap vaccine do not have any problems with it. Mild problems following Tdap (Did not interfere  with activities)  Pain where the shot was given (about 3 in 4 adolescents or 2 in 3 adults)  Redness or swelling where the shot was given (about 1 person in 5)  Mild fever of at least 100.4F (up to about 1 in 25 adolescents or 1 in 100 adults)  Headache (about 3 or 4 people in 10)  Tiredness (about 1 person in 3 or 4)  Nausea, vomiting, diarrhea, stomach ache (up to 1 in 4 adolescents or 1 in 10 adults)  Chills, sore joints (about 1 person in 10)  Body aches (about 1 person in 3 or 4)  Rash, swollen glands (uncommon) Moderate problems following Tdap (Interfered with activities, but did not require medical attention)  Pain where the shot was given (up to 1 in 5 or 6)  Redness or swelling where the shot was given (up to about 1 in 16 adolescents or 1 in 12 adults)  Fever over 102F (about 1 in 100 adolescents or 1 in 250 adults)  Headache (about 1 in 7 adolescents or 1 in 10 adults)  Nausea, vomiting, diarrhea, stomach ache (up to 1 or 3 people in 100)  Swelling of the entire arm where the shot was given (up to about 1 in 500). Severe problems following Tdap (Unable to perform usual activities; required medical attention)  Swelling, severe pain, bleeding and redness in the arm where the shot was given (rare). Problems that could happen after any vaccine:  People sometimes faint after a medical procedure, including vaccination. Sitting or lying down for about 15 minutes can help prevent fainting, and injuries caused by a fall. Tell your doctor if you feel dizzy, or have vision changes or ringing in the ears.  Some people get severe pain in the shoulder and have difficulty moving the arm where a shot was given. This happens very rarely.  Any medication can cause a severe allergic reaction. Such reactions from a vaccine are very rare, estimated at fewer than 1 in a million doses, and would happen within a few minutes to a few hours after the vaccination. As with any medicine,  there is a very remote chance of a vaccine causing a serious injury or death. The safety of vaccines is always being monitored. For more information, visit: www.cdc.gov/vaccinesafety/ 5. What if there is a serious problem? What should I look for?  Look for anything that concerns you, such as signs of a severe allergic reaction, very high fever, or unusual behavior. Signs of a severe allergic reaction can include hives, swelling of the face and throat, difficulty breathing, a fast heartbeat, dizziness, and weakness. These would usually start a few minutes to a few hours after the vaccination. What should I do?  If you think it is a severe allergic reaction or other emergency that can't wait, call 9-1-1 or get the person to the nearest hospital. Otherwise, call your doctor.  Afterward, the reaction should be reported to the Vaccine Adverse Event Reporting System (VAERS). Your doctor might file this report, or you can do it yourself through the VAERS web site at www.vaers.hhs.gov, or by calling 1-800-822-7967. VAERS does not give medical advice. 6. The National Vaccine Injury Compensation Program The National Vaccine Injury Compensation Program (  VICP) is a federal program that was created to compensate people who may have been injured by certain vaccines. Persons who believe they may have been injured by a vaccine can learn about the program and about filing a claim by calling 1-800-338-2382 or visiting the VICP website at www.hrsa.gov/vaccinecompensation. There is a time limit to file a claim for compensation. 7. How can I learn more?  Ask your doctor. He or she can give you the vaccine package insert or suggest other sources of information.  Call your local or state health department.  Contact the Centers for Disease Control and Prevention (CDC): ? Call 1-800-232-4636 (1-800-CDC-INFO) or ? Visit CDC's website at www.cdc.gov/vaccines Vaccine Information Statement Tdap Vaccine (12/11/2013) This  information is not intended to replace advice given to you by your health care provider. Make sure you discuss any questions you have with your health care provider. Document Released: 04/04/2012 Document Revised: 05/22/2018 Document Reviewed: 05/22/2018 Elsevier Interactive Patient Education  2020 Elsevier Inc.  

## 2019-06-20 ENCOUNTER — Encounter: Payer: Self-pay | Admitting: Adult Health

## 2019-06-20 LAB — LIPID PANEL
Chol/HDL Ratio: 3.5 ratio (ref 0.0–5.0)
Cholesterol, Total: 184 mg/dL (ref 100–199)
HDL: 52 mg/dL (ref >39)
LDL Chol Calc (NIH): 113 mg/dL — ABNORMAL HIGH (ref 0–99)
Triglycerides: 104 mg/dL (ref 0–149)
VLDL Cholesterol Cal: 19 mg/dL (ref 5–40)

## 2020-07-15 ENCOUNTER — Ambulatory Visit: Payer: Managed Care, Other (non HMO) | Admitting: Physician Assistant

## 2020-07-15 ENCOUNTER — Other Ambulatory Visit: Payer: Self-pay

## 2020-07-15 ENCOUNTER — Encounter: Payer: Self-pay | Admitting: Physician Assistant

## 2020-07-15 VITALS — BP 116/82 | HR 72 | Temp 97.7°F | Resp 18 | Ht 71.0 in | Wt 216.0 lb

## 2020-07-15 DIAGNOSIS — Z008 Encounter for other general examination: Secondary | ICD-10-CM

## 2020-07-15 DIAGNOSIS — Z0000 Encounter for general adult medical examination without abnormal findings: Secondary | ICD-10-CM | POA: Diagnosis not present

## 2020-07-15 NOTE — Progress Notes (Signed)
Subjective:    Patient ID: Brett Fields, male    DOB: 07/31/1991, 29 y.o.   MRN: [id number]  HPI 29 yo M Brett Fields for [facility] here from [place] -  Has been experiencing some issues with Brett Fields.Mild post nasal drip , occasional cough, itchy eyes, clear nasal discharge Now remembering as a child being treated with Zyrtec- Had not thought of it before Will consider re-starting daily Rx Uses albuterol inhaler 1-2 times per week  Had Covid vaccine March 2021 x 2.   Initially was deferring flu vaccine this year- discussed   No exercise program  5'11"   216    BMI 1  Married -Has 98 month old son Brett Fields and 2 1/2 yo son Brett Fields  No PCP at present- has completed questionnaire online and expects to be notified of an appointment  Review of Systems  GERD- Omeprazole  Chart review reveals that move from [place] was actually over 4 years ago. He has been seen in clinic before and counseled for weight loss and elevated cholesterol in 2017 and 2020    Objective:   Physical Exam Vitals and nursing note reviewed.  Constitutional:      General: He is not in acute distress.    Comments: overweight  HENT:     Head: Normocephalic and atraumatic.     Right Ear: Tympanic membrane, ear canal and external ear normal.     Left Ear: Tympanic membrane, ear canal and external ear normal.     Nose: Nose normal.     Mouth/Throat:     Mouth: Mucous membranes are moist.  Eyes:     Extraocular Movements: Extraocular movements intact.     Conjunctiva/sclera: Conjunctivae normal.  Cardiovascular:     Rate and Rhythm: Normal rate and regular rhythm.     Pulses: Normal pulses.     Heart sounds: Normal heart sounds.  Pulmonary:     Effort: Pulmonary effort is normal.     Breath sounds: Wheezing present.     Comments: LLQ with inspiratory wheeze x 3--- cleared with cough- non-productive Abdominal:     General: Bowel sounds are normal.     Palpations: Abdomen is  soft. There is no mass.     Tenderness: There is no abdominal tenderness. There is no guarding.     Comments: Overweight , not fit appearing  Genitourinary:    Comments: Denied concerns- exam deferred  Musculoskeletal:        General: Normal range of motion.     Cervical back: Normal range of motion and neck supple.  Lymphadenopathy:     Cervical: No cervical adenopathy.  Skin:    General: Skin is warm.     Capillary Refill: Capillary refill takes less than 2 seconds.  Neurological:     General: No focal deficit present.     Mental Status: He is alert.     Cranial Nerves: No cranial nerve deficit.     Deep Tendon Reflexes: Reflexes normal.  Psychiatric:        Mood and Affect: Mood normal.        Behavior: Behavior normal.       Assessment & Plan:  Discussed having 2 very small children in house and concern for respiratory illness. He has reconsidered the flu vaccine-  previously taken routinely but had thought about skipping this year " tired of it all"- Chest cleared with cough but raises concern for coming months- encourage him to reinstate  daily OTC of choice- he prefers Zyrtec-  also discussed very careful Covid precautions for exposure as Delta can commonly be asymptomatic and transmitted without realizing positivity. Children too young for vaccines.  Needs follow up with PCP   Brett Fields would benefit from an exercise program for toning  and weight loss program for CV fitness. Encouraged 30 minute brisk walk daily with increased respirations and heart rate. On pretty days could push a stroller for 1-2 for added resistance and a Mommy break for spouse.   Will reports labs as available via My Chart

## 2020-07-16 LAB — GLUCOSE, RANDOM: Glucose: 94 mg/dL (ref 65–99)

## 2020-07-16 LAB — LIPID PANEL
Chol/HDL Ratio: 4.1 ratio (ref 0.0–5.0)
Cholesterol, Total: 204 mg/dL — ABNORMAL HIGH (ref 100–199)
HDL: 50 mg/dL (ref >39)
LDL Chol Calc (NIH): 128 mg/dL — ABNORMAL HIGH (ref 0–99)
Triglycerides: 148 mg/dL (ref 0–149)
VLDL Cholesterol Cal: 26 mg/dL (ref 5–40)
